# Patient Record
Sex: Female | Born: 1945 | Smoking: Never smoker
Health system: Southern US, Community
[De-identification: ages and names within clinical notes are randomized; demographics above are authoritative.]

---

## 2009-08-05 ENCOUNTER — Ambulatory Visit
Admit: 2009-08-05 | Disposition: A | Discharge status: Home / Self Care | Payer: Self-pay | Source: Ambulatory Visit | Admitting: Obstetrics & Gynecology

## 2009-08-08 ENCOUNTER — Ambulatory Visit
Admit: 2009-08-08 | Disposition: A | Discharge status: Home / Self Care | Payer: Self-pay | Source: Ambulatory Visit | Admitting: Obstetrics & Gynecology

## 2014-10-05 ENCOUNTER — Encounter: Payer: Self-pay | Admitting: Internal Medicine

## 2014-10-05 ENCOUNTER — Ambulatory Visit: Payer: Medicare Other | Admitting: Internal Medicine

## 2014-10-05 VITALS — BP 134/85 | HR 101 | Wt 225.0 lb

## 2014-10-05 DIAGNOSIS — J301 Allergic rhinitis due to pollen: Secondary | ICD-10-CM | POA: Insufficient documentation

## 2014-10-05 DIAGNOSIS — J029 Acute pharyngitis, unspecified: Secondary | ICD-10-CM

## 2014-10-05 DIAGNOSIS — I10 Essential (primary) hypertension: Secondary | ICD-10-CM

## 2014-10-05 MED ORDER — AZITHROMYCIN 250 MG PO TABS
ORAL_TABLET | ORAL | Status: DC
Start: 2014-10-05 — End: 2014-10-05

## 2014-10-05 MED ORDER — AZITHROMYCIN 250 MG PO TABS
ORAL_TABLET | ORAL | Status: AC
Start: 2014-10-05 — End: 2014-10-10

## 2014-10-05 NOTE — Progress Notes (Signed)
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 azithromycin (ZITHROMAX Z-PAK) 250 MG tablet Take 2 tablets (500 mg) on  Day 1,  followed by 1 tablet (250 mg) once daily on Days 2 through 5. 6 tablet 0   . hydrochlorothiazide (HYDRODIURIL) 25 MG tablet TAKE ONE TABLET BY MOUTH EVERY DAY  3     No current facility-administered medications for this visit.       Review of Systems:   A comprehensive review of systems was:   General ROS: no fever chills like [place]  Respiratory ROS: no cough, shortness of breath, or wheezing  Cardiovascular ROS: no chest pain or dyspnea on exertion  Gastrointestinal ROS: no abdominal pain, change in bowel habits, or black or bloody stools  Genito-Urinary ROS: no dysuria, trouble voiding, or hematuria  Neurological ROS: no TIA or stroke symptoms    Physical Exam:     Filed Vitals:    10/05/14 1133   BP: 134/85   Pulse: 101   throat congested mucosa pale extremities are normal    General appearance - alert, well appearing, and in no distress  Chest - clear to auscultation, no wheezes, rales or rhonchi, symmetric air entry  Heart - normal rate, regular rhythm, normal S1, S2, no murmurs, rubs, clicks or gallops  Abdomen - soft, nontender, nondistended, no masses or organomegaly  Neurological - alert, oriented, normal speech, no focal findings or movement disorder noted  Extremities - peripheral pulses normal, no pedal edema, no clubbing or cyanosis    Labs:     Results     ** No results found for the last 24 hours. **            Rads:     Radiology Results (24 Hour)     ** No results found for the last 24 hours. **            Assessment       Patient Active Problem List    Diagnosis Date Noted   . HTN (hypertension), benign 10/05/2014   . Allergic rhinitis due to pollen 10/05/2014   . Acute pharyngitis 10/05/2014       Assessment  And Plan:   1.hypertension BP is well controlled on hydrochlorothiazide 25 mg daily advised potassium supplementations, bananas patient advised to consult restricted diet and regular exercise and weight loss program  2.  Allergic rhinitis advised antihistamine  3.  Acute pharyngitis patient put on azithromycin  4.  Obesity patient advised to 1800-calorie diet exercise because program  She refused flu vaccine  Plan for labs next visit she will be reviewed in 6 months    Signed by: Maida Sale

## 2014-11-16 ENCOUNTER — Ambulatory Visit: Payer: Medicare Other | Admitting: Internal Medicine

## 2014-11-16 ENCOUNTER — Encounter: Payer: Self-pay | Admitting: Internal Medicine

## 2014-11-16 VITALS — BP 119/72 | HR 101 | Ht 63.0 in

## 2014-11-16 DIAGNOSIS — Z0000 Encounter for general adult medical examination without abnormal findings: Secondary | ICD-10-CM

## 2014-11-16 DIAGNOSIS — D508 Other iron deficiency anemias: Secondary | ICD-10-CM

## 2014-11-16 DIAGNOSIS — E6609 Other obesity due to excess calories: Secondary | ICD-10-CM

## 2014-11-19 DIAGNOSIS — Z23 Encounter for immunization: Secondary | ICD-10-CM

## 2014-11-20 LAB — BASIC METABOLIC PANEL
BUN / Creatinine Ratio: 17 (ref 11–26)
BUN: 12 mg/dL (ref 8–27)
CO2: 27 mmol/L (ref 18–29)
Calcium: 10 mg/dL (ref 8.7–10.3)
Chloride: 99 mmol/L (ref 97–106)
Creatinine: 0.72 mg/dL (ref 0.57–1.00)
EGFR: 100 mL/min/{1.73_m2} (ref >59)
EGFR: 86 mL/min/{1.73_m2} (ref >59)
Glucose: 85 mg/dL (ref 65–99)
Potassium: 3.7 mmol/L (ref 3.5–5.2)
Sodium: 141 mmol/L (ref 136–144)

## 2014-11-20 LAB — CBC AND DIFFERENTIAL
Baso(Absolute): 0 10*3/uL (ref 0.0–0.2)
Basos: 0 %
Eos: 1 %
Eosinophils Absolute: 0.1 10*3/uL (ref 0.0–0.4)
Hematocrit: 38.4 % (ref 34.0–46.6)
Hemoglobin: 12.4 g/dL (ref 11.1–15.9)
Immature Granulocytes Absolute: 0 10*3/uL (ref 0.0–0.1)
Immature Granulocytes: 0 %
Lymphocytes Absolute: 4.2 10*3/uL — ABNORMAL HIGH (ref 0.7–3.1)
Lymphocytes: 41 %
MCH: 28.1 pg (ref 26.6–33.0)
MCHC: 32.3 g/dL (ref 31.5–35.7)
MCV: 87 fL (ref 79–97)
Monocytes Absolute: 0.9 10*3/uL (ref 0.1–0.9)
Monocytes: 9 %
Neutrophils Absolute: 4.9 10*3/uL (ref 1.4–7.0)
Neutrophils: 49 %
Platelets: 357 10*3/uL (ref 150–379)
RBC: 4.42 x10E6/uL (ref 3.77–5.28)
RDW: 12.8 % (ref 12.3–15.4)
WBC: 10.1 10*3/uL (ref 3.4–10.8)

## 2014-11-20 LAB — IRON PROFILE
Iron Saturation: 14 % — ABNORMAL LOW (ref 15–55)
Iron: 50 ug/dL (ref 27–139)
TIBC: 356 ug/dL (ref 250–450)
UIBC: 306 ug/dL (ref 118–369)

## 2014-11-20 LAB — HEMOGLOBIN A1C: Hemoglobin A1C: 6 % — ABNORMAL HIGH (ref 4.8–5.6)

## 2015-02-07 ENCOUNTER — Ambulatory Visit: Payer: BLUE CROSS/BLUE SHIELD | Admitting: Internal Medicine

## 2015-02-15 ENCOUNTER — Ambulatory Visit: Payer: Medicare Other | Admitting: Internal Medicine

## 2015-02-15 VITALS — BP 143/90 | HR 97 | Ht 63.0 in | Wt 220.0 lb

## 2015-02-15 DIAGNOSIS — I10 Essential (primary) hypertension: Secondary | ICD-10-CM

## 2015-02-15 DIAGNOSIS — E6609 Other obesity due to excess calories: Secondary | ICD-10-CM | POA: Insufficient documentation

## 2015-02-15 MED ORDER — HYDROCHLOROTHIAZIDE 25 MG PO TABS
ORAL_TABLET | ORAL | Status: DC
Start: 2015-02-15 — End: 2016-04-02

## 2015-05-13 NOTE — Progress Notes (Signed)
PROGRESS NOTE    Date Time: November 16, 2014  Patient Name: Jamie Sharp, Jamie Sharp    Subjective:   Patient comfort follow-up of hypertension , exogenous obesity  She is doing well  Patient is on diet exercise  Not lost much weight  Neck any muscle cramps  No history of diabetes hyperlipidemia coronary artery disease  Medications:     Current outpatient prescriptions:   .  hydroCHLOROthiazide (HYDRODIURIL) 25 MG tablet, TAKE ONE TABLET BY MOUTH EVERY DAY, Disp: 90 tablet, Rfl: 4                Review of Systems:   A comprehensive review of systems was:   General ROS: no fever chills like [place]  Respiratory ROS: no cough, shortness of breath, or wheezing  Cardiovascular ROS: no chest pain or dyspnea on exertion  Gastrointestinal ROS: no abdominal pain, change in bowel habits, or black or bloody stools  Genito-Urinary ROS: no dysuria, trouble voiding, or hematuria  Neurological ROS: no TIA or stroke symptoms    Physical Examnination  Filed Vitals:    11/16/14 1223   BP: 119/72   Pulse: 101   Height: 1.6 m (5\' 3" )   SpO2: 100%     Height 5 feet 3 inches    General appearance - alert, well appearing, and in no distress  Chest - clear to auscultation, no wheezes, rales or rhonchi, symmetric air entry  Heart - normal rate, regular rhythm, normal S1, S2, no murmurs, rubs, clicks or gallops  Abdomen - soft, nontender, nondistended, no masses or organomegaly  Neurological - alert, oriented, normal speech, no focal findings or movement disorder noted  Extremities - peripheral pulses normal, no pedal edema, no clubbing or cyanosis    Labs:      Advised CBC Chem-7 lipid panel             Rads:     Radiology Results (24 Hour)     ** No results found for the last 24 hours. **            Assessment      Patient Active Problem List    Diagnosis Date Noted   . HTN (hypertension), benign 10/05/2014   . Allergic rhinitis due to pollen 10/05/2014   . Acute pharyngitis 10/05/2014        Assessment And Plan:   1.Hypertension BP is well controlled on hydrochlorothiazide 25 mg daily advised potassium supplementations, bananas. patient advised to continue 2 g salt restricted diet and regular exercise and weight loss program  2. Allergic rhinitis advised antihistamine  4. Obesity patient advised to 1800-calorie diet exercise because program  She refused flu vaccine  Plan for labs  she will be reviewed in 6 months    Signed by: Maida Sale

## 2015-05-16 ENCOUNTER — Ambulatory Visit: Payer: BLUE CROSS/BLUE SHIELD | Admitting: Internal Medicine

## 2015-05-19 NOTE — Progress Notes (Signed)
PROGRESS NOTE    Date Time: February 15, 2015  Patient Name: Jamie Sharp, Jamie Sharp    Subjective:   Patient comfort follow-up of hypertension , exogenous obesity  She is doing well.she is very concerned about tingling numbness of her left hand.  She was to make sure nothing serious going on.  Denies any neck pain and radical symptoms denies any diplopia or dysphagia   she has no risk  factors other than mild hypertension and obesity  Patient is on diet exercise  Not lost much weight  Neck any muscle cramps  No history of diabetes hyperlipidemia coronary artery disease  Medications:     Current outpatient prescriptions:   . hydroCHLOROthiazide (HYDRODIURIL) 25 MG tablet, TAKE ONE TABLET BY MOUTH EVERY DAY, Disp: 90 tablet, Rfl: 4                Review of Systems:   A comprehensive review of systems was:   General ROS: no fever chills like [place]  Respiratory ROS: no cough, shortness of breath, or wheezing  Cardiovascular ROS: no chest pain or dyspnea on exertion  Gastrointestinal ROS: no abdominal pain, change in bowel habits, or black or bloody stools  Genito-Urinary ROS: no dysuria, trouble voiding, or hematuria  Neurological ROS: no TIA or stroke symptoms    Physical Examnination  Filed Vitals:    02/15/15 1254   BP: 143/90   Pulse: 97   Height: 1.6 m (5\' 3" )   Weight: 99.791 kg (220 lb)   SpO2: 96%       Height 5 feet 3 inches    General appearance - alert, well appearing, and in no distress  Chest - clear to auscultation, no wheezes, rales or rhonchi, symmetric air entry  Heart - normal rate, regular rhythm, normal S1, S2, no murmurs, rubs, clicks or gallops  Abdomen - soft, nontender, nondistended, no masses or organomegaly  Neurological - alert, oriented, normal speech, no focal findings or movement disorder noted  Extremities - peripheral pulses normal, no pedal edema, no clubbing or cyanosis    Labs:      Advised CBC Chem-7 lipid panel             Rads:     Radiology Results (24  Hour)     ** No results found for the last 24 hours. **            Assessment      Patient Active Problem List    Diagnosis Date Noted   . HTN (hypertension), benign 10/05/2014   . Allergic rhinitis due to pollen 10/05/2014   . Acute pharyngitis 10/05/2014       Assessment And Plan:   1.Hypertension BP is well controlled on hydrochlorothiazide 25 mg daily advised potassium supplementations, bananas. patient advised to continue 2 g salt restricted diet and regular exercise and weight loss program  2. Allergic rhinitis advised antihistamine  4. Obesity patient advised to 1800-calorie diet exercise because program  5. Carpal tunnel syndrome  Patient given reassurance    Plan for labs she will be reviewed in 6 months    Signed by: Maida Sale

## 2015-07-23 ENCOUNTER — Ambulatory Visit: Payer: BLUE CROSS/BLUE SHIELD | Admitting: Internal Medicine

## 2015-07-23 ENCOUNTER — Encounter: Payer: Self-pay | Admitting: Internal Medicine

## 2015-07-23 ENCOUNTER — Ambulatory Visit: Payer: Medicare Other | Admitting: Internal Medicine

## 2015-07-23 DIAGNOSIS — J301 Allergic rhinitis due to pollen: Secondary | ICD-10-CM

## 2015-07-23 DIAGNOSIS — I10 Essential (primary) hypertension: Secondary | ICD-10-CM

## 2015-07-23 DIAGNOSIS — Z0000 Encounter for general adult medical examination without abnormal findings: Secondary | ICD-10-CM | POA: Insufficient documentation

## 2015-07-23 DIAGNOSIS — R7303 Prediabetes: Secondary | ICD-10-CM

## 2015-07-23 DIAGNOSIS — K635 Polyp of colon: Secondary | ICD-10-CM

## 2015-07-23 HISTORY — DX: Polyp of colon: K63.5

## 2015-07-23 HISTORY — DX: Prediabetes: R73.03

## 2015-07-23 MED ORDER — AZITHROMYCIN 250 MG PO TABS
ORAL_TABLET | ORAL | Status: AC
Start: 2015-07-23 — End: 2015-07-28

## 2015-07-23 NOTE — Progress Notes (Signed)
Date Time: 07/23/2015 12:42 PM  Patient Name: Jamie Sharp, Jamie Sharp   DOB: [birth year]    Subjective:   Jamie Sharp is a 70 y.o. female who presents for an annual physical examination she has been deemed prediabetic sometime ago she does admit that she is not good with her diet.  She is up-to-date with her Pap smears mammograms she also had a colonoscopy 2 or 3 years ago and she said she found some polyps which was removed.  No breathing troubles or chest pains no polyuria polydipsia    Past Medical History:     Past Medical History   Diagnosis Date   . Colon polyps 07/23/2015   . Prediabetes 07/23/2015       Past Surgical History:   History reviewed. No pertinent past surgical history.    Family History:     Family History   Problem Relation Age of Onset   . No known problems Mother    . No known problems Father    . No known problems Maternal Grandmother    . No known problems Maternal Grandfather    . No known problems Paternal Grandmother    . No known problems Paternal Grandfather        Social History:     Social History     Social History   . Marital Status: Divorced     Spouse Name: N/A   . Number of Children: N/A   . Years of Education: N/A     Social History Main Topics   . Smoking status: Never Smoker    . Smokeless tobacco: None   . Alcohol Use: None   . Drug Use: None   . Sexual Activity: Not Asked     Other Topics Concern   . None     Social History Narrative       Allergies:     Allergies   Allergen Reactions   . Amoxicillin Angioedema   . Codeine      hallucination   . Cortisone        Medications:     Current Outpatient Prescriptions   Medication Sig Dispense Refill   . hydroCHLOROthiazide (HYDRODIURIL) 25 MG tablet TAKE ONE TABLET BY MOUTH EVERY DAY 90 tablet 4   . azithromycin (ZITHROMAX Z-PAK) 250 MG tablet Take 2 tablets (500 mg) on  Day 1,  followed by 1 tablet (250 mg) once daily on Days 2 through 5. 6 tablet 0     No current facility-administered medications for this visit.        Review of Systems:   A comprehensive review of systems was:   General ROS:   Respiratory ROS: no cough, shortness of breath, or wheezing  Cardiovascular ROS: no chest pain or dyspnea on exertion  Gastrointestinal ROS: no abdominal pain, change in bowel habits, or black or bloody stools  Genito-Urinary ROS: no dysuria, trouble voiding, or hematuria  Neurological ROS: no TIA or stroke symptoms    Physical Exam:     Filed Vitals:    07/23/15 1206   BP: 148/98   Pulse: 86   SpO2: 96%     BP Readings from Last 3 Encounters:   07/23/15 148/98   02/15/15 143/90   11/16/14 119/72       General appearance - alert, well appearing, and in no distress  Chest - clear to auscultation, no wheezes, rales or rhonchi, symmetric air entry  Heart - normal rate, regular rhythm, normal S1, S2, no  murmurs, rubs, clicks or gallops  Abdomen - soft, nontender, nondistended, no masses or organomegaly  Neurological - alert, oriented, normal speech, no focal findings or movement disorder noted  Extremities - peripheral pulses normal, no pedal edema, no clubbing or cyanosis    Labs:   No results found for this or any previous visit (from the past 2016 hour(s)).    Assessment and Plan:     Patient Active Problem List   Diagnosis   . HTN (hypertension), benign   . Allergic rhinitis due to pollen   . Acute pharyngitis   . 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 Colon polyps   . Routine history and physical examination of adult   . Prediabetes       Orders Placed This Encounter   Procedures   . Hemoglobin A1C       1. nonmorbid obesity she was encouraged to try to exercise and lose weight however in view of her age, actually that she is able to do that    2.  Prediabetes I will check her A1c and she will call me back in 1 week's time    3.  Colonic polyps status post polypectomy she will probably have to go back for repeat colonoscopy at some stage    4.  Upper respiratory tract infection Zithromax Tri-Pak was given along with  over-the-counter Benadryl for scratchy throat    5.  Hypertension appears to be very well controlled with a good BMP    6.  Healthcare maintenance no need for blood work to be done RTC in 3 months sooner if need be    Signed by: Nyra Jabs

## 2015-07-27 LAB — HEMOGLOBIN A1C: Hemoglobin A1C: 5.5 % (ref 4.8–5.6)

## 2016-03-28 ENCOUNTER — Other Ambulatory Visit: Payer: Self-pay | Admitting: Internal Medicine

## 2016-03-30 NOTE — Telephone Encounter (Signed)
Long-time no see

## 2016-04-01 NOTE — Telephone Encounter (Signed)
Left a voicemail to schedule

## 2016-04-02 ENCOUNTER — Other Ambulatory Visit: Payer: Self-pay | Admitting: Internal Medicine

## 2016-04-02 MED ORDER — HYDROCHLOROTHIAZIDE 25 MG PO TABS
ORAL_TABLET | ORAL | 0 refills | Status: DC
Start: 2016-04-02 — End: 2016-04-07

## 2016-04-07 ENCOUNTER — Ambulatory Visit: Payer: Medicare Other | Admitting: Internal Medicine

## 2016-04-07 ENCOUNTER — Encounter: Payer: Self-pay | Admitting: Internal Medicine

## 2016-04-07 VITALS — BP 138/85 | HR 80 | Ht 63.0 in | Wt 218.0 lb

## 2016-04-07 DIAGNOSIS — R7303 Prediabetes: Secondary | ICD-10-CM

## 2016-04-07 DIAGNOSIS — I10 Essential (primary) hypertension: Secondary | ICD-10-CM

## 2016-04-07 DIAGNOSIS — G5602 Carpal tunnel syndrome, left upper limb: Secondary | ICD-10-CM | POA: Insufficient documentation

## 2016-04-07 MED ORDER — HYDROCHLOROTHIAZIDE 25 MG PO TABS
ORAL_TABLET | ORAL | 6 refills | Status: DC
Start: 2016-04-07 — End: 2017-05-07

## 2016-04-07 NOTE — Progress Notes (Signed)
Date Time: 04/07/2016 10:44 PM  Patient Name: ANAJULIA, LEYENDECKER   DOB: November 18, 1945    Subjective:   Retta Diones Eriyonna Matsushita is a 71 y.o. female who presents for an annual physical examination      Patient Is a Known Case of Obesity, Hypertension And past History of Right Upper Extremity Carpal Tunnel Syndrome.   Patient Complains of Left Hand Going Numb on and off..  It Improves after Shaking. She Denies Any Radicular Symptoms. Patient  History. 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 She Denies Any Muscle Cramps   Patient Gives a History of Postmenopausal Spotting. She Had a Pelvic Sonogram Was Told to Be Normal. She Is Scheduled to Have Fractional Curettage Biopsy      Past Medical History:     Past Medical History:   Diagnosis Date   . Colon polyps 07/23/2015   . Prediabetes 07/23/2015       Past Surgical History:   History reviewed. No pertinent surgical history.    Family History:     Family History   Problem Relation Age of Onset   . No known problems Mother    . No known problems Father    . No known problems Maternal Grandmother    . No known problems Maternal Grandfather    . No known problems Paternal Grandmother    . No known problems Paternal Grandfather        Social History:     Social History     Social History   . Marital status: Divorced     Spouse name: N/A   . Number of children: N/A   . Years of education: N/A     Social History Main Topics   . Smoking status: Never Smoker   . Smokeless tobacco: None   . Alcohol use None   . Drug use: Unknown   . Sexual activity: Not Asked     Other Topics Concern   . None     Social History Narrative   . None       Allergies:     Allergies   Allergen Reactions   . Amoxicillin Angioedema   . Codeine      hallucination   . Cortisone        Medications:     Current Outpatient  Prescriptions   Medication Sig Dispense Refill   . hydroCHLOROthiazide (HYDRODIURIL) 25 MG tablet TAKE ONE TABLET BY MOUTH EVERY DAY 90 tablet 6     No current facility-administered medications for this visit.        Review of Systems:   A comprehensive review of systems was:   General ROS:   Respiratory ROS: no cough, shortness of breath, or wheezing  Cardiovascular ROS: no chest pain or dyspnea on exertion  Gastrointestinal ROS: no abdominal pain, change in bowel habits, or black or bloody stools  Genito-Urinary ROS:  Complaints of Postmenopausal Bleeding  Neurological ROS: no TIA or stroke symptoms    Physical Exam:     Vitals:    04/07/16 1315   BP: 138/85   Pulse: 80   SpO2: 98%     BP Readings from Last 3 Encounters:   04/07/16 138/85   07/23/15 (!) 148/98   02/15/15 143/90     No Pallor,  icterus, clubbing,lymphadenopathy  Neck is supple  No xanthoma , xanthelesma  No carotid bruit  JVP not elevated  No thyroidomegaly  HEENT PEERLA    General appearance - alert, well appearing, and in no distress  Chest - clear to auscultation, no wheezes, rales or rhonchi, symmetric air entry  Heart - normal rate, regular rhythm, normal S1, S2, no murmurs, rubs, clicks or gallops  Abdomen -  No OrganomegalyNeurological - alert, oriented, normal speech, no focal findings or movement disorder noted  Extremities - peripheral pulses normal, no pedal edema, no clubbing or cyanosis   Left and No Wasting of Intervention  Labs:   No results found for this or any previous visit (from the past 2016 hour(s)).    Assessment and Plan:       1. Carpal tunnel syndrome/ patient advised nerve conduction.    2. Hypertension appears to be very well controlled   3.  Colonic polyps status post polypectomy   4.   Postmenopausal Bleeding. She Has Mild Spotting She Already Had Pelvic Sonogram Told to Be Normal Patient Scheduled for Fractional Biopsy A GYN  5.   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   Obesity. Patient Advised  Diet and Exercise Weight Loss Program    Patient Active Problem List   Diagnosis   . HTN (hypertension), benign   . Allergic rhinitis due to pollen   . Acute pharyngitis   . 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 Colon polyps   . Routine history and physical examination of adult   . Prediabetes   . Carpal tunnel syndrome of left wrist       Orders Placed This Encounter   Procedures   . Basic Metabolic Panel     Order Specific Question:   Has the patient fasted?     Answer:   Yes   . CBC and differential   . Hemoglobin A1C   . Hepatic function panel (LFT)   . Lipid panel     Order Specific Question:   Has the patient fasted?     Answer:   Yes   . TSH   . Urinalysis     Order Specific Question:   URINE TYPE     Answer:   Clean Catch       Signed by: Maida Sale

## 2016-04-10 LAB — HEPATIC FUNCTION PANEL
ALT: 20 IU/L (ref 0–32)
AST (SGOT): 17 IU/L (ref 0–40)
Albumin: 4.1 g/dL (ref 3.6–4.8)
Alkaline Phosphatase: 74 IU/L (ref 39–117)
Bilirubin Direct: 0.15 mg/dL (ref 0.00–0.40)
Bilirubin, Total: 0.5 mg/dL (ref 0.0–1.2)
Protein, Total: 7.1 g/dL (ref 6.0–8.5)

## 2016-04-10 LAB — LIPID PANEL
Cholesterol / HDL Ratio: 2.2 ratio (ref 0.0–4.4)
Cholesterol: 181 mg/dL (ref 100–199)
HDL: 81 mg/dL (ref >39)
LDL Calculated: 89 mg/dL (ref 0–99)
Triglycerides: 53 mg/dL (ref 0–149)
VLDL Calculated: 11 mg/dL (ref 5–40)

## 2016-04-10 LAB — CBC AND DIFFERENTIAL
Baso(Absolute): 0 10*3/uL (ref 0.0–0.2)
Basos: 0 %
Eos: 1 %
Eosinophils Absolute: 0.1 10*3/uL (ref 0.0–0.4)
Hematocrit: 38.3 % (ref 34.0–46.6)
Hemoglobin: 12.4 g/dL (ref 11.1–15.9)
Immature Granulocytes Absolute: 0 10*3/uL (ref 0.0–0.1)
Immature Granulocytes: 0 %
Lymphocytes Absolute: 3.1 10*3/uL (ref 0.7–3.1)
Lymphocytes: 42 %
MCH: 28.1 pg (ref 26.6–33.0)
MCHC: 32.4 g/dL (ref 31.5–35.7)
MCV: 87 fL (ref 79–97)
Monocytes Absolute: 0.6 10*3/uL (ref 0.1–0.9)
Monocytes: 8 %
Neutrophils Absolute: 3.6 10*3/uL (ref 1.4–7.0)
Neutrophils: 49 %
Platelets: 362 10*3/uL (ref 150–379)
RBC: 4.42 x10E6/uL (ref 3.77–5.28)
RDW: 12.9 % (ref 12.3–15.4)
WBC: 7.4 10*3/uL (ref 3.4–10.8)

## 2016-04-10 LAB — BASIC METABOLIC PANEL
BUN / Creatinine Ratio: 13 (ref 12–28)
BUN: 10 mg/dL (ref 8–27)
CO2: 26 mmol/L (ref 18–29)
Calcium: 9.7 mg/dL (ref 8.7–10.3)
Chloride: 103 mmol/L (ref 96–106)
Creatinine: 0.75 mg/dL (ref 0.57–1.00)
EGFR: 82 mL/min/{1.73_m2} (ref >59)
EGFR: 94 mL/min/{1.73_m2} (ref >59)
Glucose: 83 mg/dL (ref 65–99)
Potassium: 4.1 mmol/L (ref 3.5–5.2)
Sodium: 142 mmol/L (ref 134–144)

## 2016-04-10 LAB — TSH: TSH: 0.546 u[IU]/mL (ref 0.450–4.500)

## 2016-04-10 LAB — HEMOGLOBIN A1C: Hemoglobin A1C: 5.7 % — ABNORMAL HIGH (ref 4.8–5.6)

## 2016-05-06 ENCOUNTER — Encounter: Payer: Self-pay | Admitting: Internal Medicine

## 2016-05-07 ENCOUNTER — Other Ambulatory Visit: Payer: Self-pay | Admitting: Internal Medicine

## 2016-05-07 MED ORDER — AZITHROMYCIN 250 MG PO TABS
ORAL_TABLET | ORAL | 0 refills | Status: DC
Start: 2016-05-07 — End: 2016-09-21

## 2016-08-25 ENCOUNTER — Other Ambulatory Visit: Payer: Self-pay | Admitting: Internal Medicine

## 2016-08-25 DIAGNOSIS — I10 Essential (primary) hypertension: Secondary | ICD-10-CM

## 2016-08-25 DIAGNOSIS — R7303 Prediabetes: Secondary | ICD-10-CM

## 2016-08-25 DIAGNOSIS — J301 Allergic rhinitis due to pollen: Secondary | ICD-10-CM

## 2016-08-25 DIAGNOSIS — Z0000 Encounter for general adult medical examination without abnormal findings: Secondary | ICD-10-CM

## 2016-08-25 DIAGNOSIS — J029 Acute pharyngitis, unspecified: Secondary | ICD-10-CM

## 2016-08-25 DIAGNOSIS — G5602 Carpal tunnel syndrome, left upper limb: Secondary | ICD-10-CM

## 2016-08-25 DIAGNOSIS — K635 Polyp of colon: Secondary | ICD-10-CM

## 2016-08-26 ENCOUNTER — Other Ambulatory Visit: Payer: Self-pay | Admitting: Internal Medicine

## 2016-08-26 DIAGNOSIS — Z1239 Encounter for other screening for malignant neoplasm of breast: Secondary | ICD-10-CM

## 2016-09-21 ENCOUNTER — Other Ambulatory Visit: Payer: Self-pay | Admitting: Internal Medicine

## 2016-09-21 MED ORDER — AZITHROMYCIN 250 MG PO TABS
ORAL_TABLET | ORAL | 0 refills | Status: DC
Start: 2016-09-21 — End: 2016-12-11

## 2016-12-11 ENCOUNTER — Encounter: Payer: Self-pay | Admitting: Internal Medicine

## 2016-12-11 ENCOUNTER — Ambulatory Visit: Payer: Medicare Other | Admitting: Internal Medicine

## 2016-12-11 VITALS — BP 185/100 | HR 113 | Ht 63.0 in | Wt 215.0 lb

## 2016-12-11 DIAGNOSIS — R7303 Prediabetes: Secondary | ICD-10-CM

## 2016-12-11 DIAGNOSIS — J069 Acute upper respiratory infection, unspecified: Secondary | ICD-10-CM | POA: Insufficient documentation

## 2016-12-11 DIAGNOSIS — I10 Essential (primary) hypertension: Secondary | ICD-10-CM

## 2016-12-11 MED ORDER — AMLODIPINE BESYLATE 5 MG PO TABS
5.0000 mg | ORAL_TABLET | Freq: Every day | ORAL | 4 refills | Status: DC
Start: 2016-12-11 — End: 2016-12-31

## 2016-12-11 MED ORDER — AZITHROMYCIN 250 MG PO TABS
ORAL_TABLET | ORAL | 0 refills | Status: DC
Start: 2016-12-11 — End: 2016-12-31

## 2016-12-11 NOTE — Progress Notes (Signed)
Tara Rud S Janisse Ghan.M.D.  [facility]  [address]  [place] [address]  Tel. [phone]  Fax [phone]        Date Time: 12/11/2016 10:59 PM  Patient Name: Jamie Sharp, Jamie Sharp   DOB: 1945/08/10    Subjective:   Jamie Sharp is a 71 y.o. female who presents for urgent appointment     Patient Is a Known Case of Obesity, Hypertension And past History of Right Upper Extremity Carpal Tunnel Syndrome.      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  She gives history of stress.   patient complains of stuffy nose postnasal drip and yellow phlegm   and blood pressure is running high at home   Patient Denies Any Chest Pain Orthopnea PND. She Denies Any Muscle Cramps   Patient Gives a History of Postmenopausal Spotting. She Had a Pelvic Sonogram Was Told to Be Normal. She Is Scheduled to Have Fractional Curettage Biopsy      Past Medical History:     Past Medical History:   Diagnosis Date   . Colon polyps 07/23/2015   . Prediabetes 07/23/2015       Past Surgical History:   No past surgical history on file.    Family History:     Family History   Problem Relation Age of Onset   . No known problems Mother    . No known problems Father    . No known problems Maternal Grandmother    . No known problems Maternal Grandfather    . No known problems Paternal Grandmother    . No known problems Paternal Grandfather        Social History:     Social History     Social History   . Marital status: Divorced     Spouse name: N/A   . Number of children: N/A   . Years of education: N/A     Social History Main Topics   . Smoking status: Never Smoker   . Smokeless tobacco: Never Used   . Alcohol use None   . Drug use: Unknown   . Sexual activity: Not Asked     Other Topics Concern   . None     Social History Narrative   . None       Allergies:     Allergies   Allergen Reactions   . Amoxicillin Angioedema   . Codeine      hallucination   . Cortisone        Medications:     Current  Outpatient Prescriptions   Medication Sig Dispense Refill   . hydroCHLOROthiazide (HYDRODIURIL) 25 MG tablet TAKE ONE TABLET BY MOUTH EVERY DAY 90 tablet 6   . amLODIPine (NORVASC) 5 MG tablet Take 1 tablet (5 mg total) by mouth daily. 60 tablet 4   . azithromycin (ZITHROMAX) 250 MG tablet Take two tabs one day and onebtab daily four days 6 tablet 0     No current facility-administered medications for this visit.        Review of Systems:   A comprehensive review of systems was:   General ROS:   Respiratory ROS: no cough, shortness of breath, or wheezing  Cardiovascular ROS: no chest pain or dyspnea on exertion  Gastrointestinal ROS: no abdominal pain, change in bowel habits, or black or bloody stools  Genito-Urinary ROS:  Complaints of Postmenopausal Bleeding  Neurological ROS: no TIA or  stroke symptoms    Physical Exam:     Vitals:    12/11/16 1302   BP: (!) 185/100   Pulse: (!) 113   SpO2: (!) 87%     BP Readings from Last 3 Encounters:   12/11/16 (!) 185/100   04/07/16 138/85   07/23/15 (!) 148/98     No Pallor, icterus, clubbing,lymphadenopathy  Neck is supple  No xanthoma , xanthelesma  No carotid bruit  JVP not elevated  No thyroidomegaly  HEENT PEERLA    General appearance - alert, well appearing, and in no distress  Chest - clear to auscultation, no wheezes, rales or rhonchi, symmetric air entry  Heart - normal rate, regular rhythm, normal S1, S2, no murmurs, rubs, clicks or gallops  Abdomen -  No OrganomegalyNeurological - alert, oriented, normal speech, no focal findings or movement disorder noted  Extremities - peripheral pulses normal, no pedal edema, no clubbing or cyanosis   Left and No Wasting of Intervention  Labs:   No results found for this or any previous visit (from the past 2016 hour(s)).    Assessment and Plan:       1. . Acute upper respiratory tract infection. Patient put on Z-Pak.advised claritin    2.  Accelerated  Hypertension .  Pressure is significantly elevated she is not consistently  taking diuretic. Also she is stressed out because of her mother's. Was elderly and 89   patient started on amlodipine 5 mg daily she is advised to take hydrochlorothiazide every day. Patient advised to follow 2 g salt restricted diet. Should be reviewed in 2 weeks time dressed to come with a logbook of her Pressure readings    3.  Colonic polyps status post polypectomy   4. Anxiety. Patient taking care of her elderly mother. She is advised to discuss about day care center     5.   Obesity. Patient Advised Diet and Exercise Weight Loss Program  7.     Carpal tunnel syndrome symptoms have improved    Patient Active Problem List   Diagnosis   . HTN (hypertension), benign   . Allergic rhinitis due to pollen   . Acute pharyngitis   . 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 Colon polyps   . Routine history and physical examination of adult   . Prediabetes   . Carpal tunnel syndrome of left wrist       No orders of the defined types were placed in this encounter.      Signed by: Maida Sale

## 2016-12-15 ENCOUNTER — Telehealth: Payer: Self-pay | Admitting: Internal Medicine

## 2016-12-15 NOTE — Telephone Encounter (Signed)
Per dr Elza Rafter pt was instructed to take amoldipine and hctz daily together . However pt reports she take the amlodipine in the am and hctz in the evening because it make her use the restroom and she works in a classroom during the day

## 2016-12-31 ENCOUNTER — Encounter: Payer: Self-pay | Admitting: Internal Medicine

## 2016-12-31 ENCOUNTER — Ambulatory Visit: Payer: Medicare Other | Admitting: Internal Medicine

## 2016-12-31 ENCOUNTER — Ambulatory Visit: Payer: BLUE CROSS/BLUE SHIELD | Admitting: Internal Medicine

## 2016-12-31 VITALS — BP 151/101 | HR 107 | Ht 63.0 in | Wt 208.0 lb

## 2016-12-31 DIAGNOSIS — I10 Essential (primary) hypertension: Secondary | ICD-10-CM

## 2016-12-31 DIAGNOSIS — R7303 Prediabetes: Secondary | ICD-10-CM

## 2016-12-31 NOTE — Progress Notes (Signed)
Date Time: 12/31/2016 12:58 PM  Patient Name: Jamie Sharp, Jamie Sharp   DOB: 14-Nov-1945    Subjective:   Jamie Sharp is a 71 y.o. female who presents for a follow-up visit  She was given amlodipine about 3 weeks ago to get her blood pressure under control however she says that she could not tolerate it she felt she was going to die and she stopped it  Currently no breathing troubles or chest pains no bowel or urinary problems    Past Medical History:     Past Medical History:   Diagnosis Date   . Colon polyps 07/23/2015   . Prediabetes 07/23/2015       Past Surgical History:   No past surgical history on file.    Family History:     Family History   Problem Relation Age of Onset   . No known problems Mother    . No known problems Father    . No known problems Maternal Grandmother    . No known problems Maternal Grandfather    . No known problems Paternal Grandmother    . No known problems Paternal Grandfather        Social History:     Social History     Social History   . Marital status: Divorced     Spouse name: N/A   . Number of children: N/A   . Years of education: N/A     Social History Main Topics   . Smoking status: Never Smoker   . Smokeless tobacco: Never Used   . Alcohol use None   . Drug use: Unknown   . Sexual activity: Not Asked     Other Topics Concern   . None     Social History Narrative   . None       Allergies:     Allergies   Allergen Reactions   . Amoxicillin Angioedema   . Codeine      hallucination   . Cortisone        Medications:     Current Outpatient Prescriptions   Medication Sig Dispense Refill   . hydroCHLOROthiazide (HYDRODIURIL) 25 MG tablet TAKE ONE TABLET BY MOUTH EVERY DAY 90 tablet 6     No current facility-administered medications for this visit.        Review of Systems:   A comprehensive review of systems was:   General ROS:   Respiratory ROS: no cough, shortness of breath, or wheezing  Cardiovascular ROS: no chest pain or dyspnea on exertion  Gastrointestinal  ROS: no abdominal pain, change in bowel habits, or black or bloody stools  Genito-Urinary ROS: no dysuria, trouble voiding, or hematuria  Neurological ROS: no TIA or stroke symptoms    Physical Exam:     Vitals:    12/31/16 1139   BP: (!) 151/101   Pulse: (!) 107   SpO2: 99%     BP Readings from Last 3 Encounters:   12/31/16 (!) 151/101   12/11/16 (!) 185/100   04/07/16 138/85       General appearance - alert, well appearing, and in no distress  Chest - clear to auscultation, no wheezes, rales or rhonchi, symmetric air entry  Heart - normal rate, regular rhythm, normal S1, S2, no murmurs, rubs, clicks or gallops  Abdomen - soft, nontender, nondistended, no masses or organomegaly  Neurological - alert, oriented, normal speech, no focal findings or movement disorder noted  Extremities - peripheral pulses normal, no pedal  edema, no clubbing or cyanosis    Labs:   No results found for this or any previous visit (from the past 2016 hour(s)).    Assessment and Plan:     Patient Active Problem List   Diagnosis   . HTN (hypertension), benign   . 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 Prediabetes   . URTI (acute upper respiratory infection)       No orders of the defined types were placed in this encounter.      1. nonmorbid obesity she was encouraged to try to exercise and lose weight however in view of her age, actually that she is able to do that    2.  Prediabetes follow-up with A1c next visit    3.  Colonic polyps status post polypectomy she will probably have to go back for repeat colonoscopy at some stage    4.  Hypertension not well-controlled E Darby 40/25 mg was written out is the only thing she should take take blood pressure readings and come back in 2 weeks' time for follow-up    Signed by: Nyra Jabs

## 2017-01-18 ENCOUNTER — Ambulatory Visit: Payer: BLUE CROSS/BLUE SHIELD | Admitting: Internal Medicine

## 2017-01-20 ENCOUNTER — Encounter: Payer: Self-pay | Admitting: Internal Medicine

## 2017-01-21 ENCOUNTER — Ambulatory Visit: Payer: BLUE CROSS/BLUE SHIELD | Admitting: Internal Medicine

## 2017-01-27 ENCOUNTER — Ambulatory Visit: Payer: BLUE CROSS/BLUE SHIELD | Admitting: Internal Medicine

## 2017-02-09 ENCOUNTER — Ambulatory Visit: Payer: BLUE CROSS/BLUE SHIELD | Admitting: Internal Medicine

## 2017-02-11 ENCOUNTER — Other Ambulatory Visit: Payer: Self-pay | Admitting: Internal Medicine

## 2017-02-11 MED ORDER — AZITHROMYCIN 250 MG PO TABS
ORAL_TABLET | ORAL | 0 refills | Status: AC
Start: 2017-02-11 — End: 2017-02-16

## 2017-02-23 ENCOUNTER — Ambulatory Visit: Payer: BLUE CROSS/BLUE SHIELD | Admitting: Internal Medicine

## 2017-03-02 ENCOUNTER — Ambulatory Visit: Payer: BLUE CROSS/BLUE SHIELD | Admitting: Internal Medicine

## 2017-03-09 ENCOUNTER — Ambulatory Visit: Payer: BLUE CROSS/BLUE SHIELD | Admitting: Internal Medicine

## 2017-03-11 ENCOUNTER — Ambulatory Visit: Payer: BLUE CROSS/BLUE SHIELD | Admitting: Internal Medicine

## 2017-03-12 ENCOUNTER — Ambulatory Visit: Payer: Medicare Other | Admitting: Internal Medicine

## 2017-03-12 ENCOUNTER — Encounter: Payer: Self-pay | Admitting: Internal Medicine

## 2017-03-12 VITALS — BP 142/90 | HR 78 | Wt 211.0 lb

## 2017-03-12 DIAGNOSIS — Z0000 Encounter for general adult medical examination without abnormal findings: Secondary | ICD-10-CM

## 2017-03-12 DIAGNOSIS — I10 Essential (primary) hypertension: Secondary | ICD-10-CM

## 2017-03-12 DIAGNOSIS — M1712 Unilateral primary osteoarthritis, left knee: Secondary | ICD-10-CM | POA: Insufficient documentation

## 2017-03-12 DIAGNOSIS — J069 Acute upper respiratory infection, unspecified: Secondary | ICD-10-CM

## 2017-03-12 DIAGNOSIS — R7303 Prediabetes: Secondary | ICD-10-CM

## 2017-03-12 NOTE — Progress Notes (Signed)
Eiliana Drone S Azyriah Nevins.M.D.  [facility]  [address]  [place] [address]  Tel. [phone]  Fax [phone]        Date Time:03/12/17 0959  Patient Name: Jamie Sharp, Jamie Sharp   DOB: August 12, 1945    Subjective:   Jamie Sharp is a 72 y.o. female who presents for  Follow-up appointment     Patient Is a Known Case of Obesity, Hypertension, past History of Right Upper Extremity Carpal Tunnel Syndrome. And osteoarthritis left knee      She Is Taking Care Of Her Elderly Mom Most 37 Years Old.    patient is doing well   she had labs done in last March she has an excellent LDL fell and HDL of 81      Patient Denies Any Chest Pain Orthopnea PND. She Denies Any Muscle Cramps   patient complains of pain left knee on and off for about is manageable   she is advised to get a blood pressure monitor to keep a recording of 4 blood pressure readings       Past Medical History:     Past Medical History:   Diagnosis Date   . Colon polyps 07/23/2015   . Prediabetes 07/23/2015       Past Surgical History:   No past surgical history on file.    Family History:     Family History   Problem Relation Age of Onset   . No known problems Mother    . No known problems Father    . No known problems Maternal Grandmother    . No known problems Maternal Grandfather    . No known problems Paternal Grandmother    . No known problems Paternal Grandfather        Social History:     Social History     Social History   . Marital status: Divorced     Spouse name: N/A   . Number of children: N/A   . Years of education: N/A     Social History Main Topics   . Smoking status: Never Smoker   . Smokeless tobacco: Never Used   . Alcohol use None   . Drug use: Unknown   . Sexual activity: Not Asked     Other Topics Concern   . None     Social History Narrative   . None       Allergies:     Allergies   Allergen Reactions   . Amoxicillin Angioedema   . Codeine      hallucination   . Cortisone        Medications:      Current Outpatient Prescriptions   Medication Sig Dispense Refill   . hydroCHLOROthiazide (HYDRODIURIL) 25 MG tablet TAKE ONE TABLET BY MOUTH EVERY DAY 90 tablet 6     No current facility-administered medications for this visit.        Review of Systems:   A comprehensive review of systems was:   General ROS:   Respiratory ROS: no cough, shortness of breath, or wheezing  Cardiovascular ROS: no chest pain or dyspnea on exertion  Gastrointestinal ROS: no abdominal pain, change in bowel habits, or black or bloody stools  Genito-Urinary ROS:  Complaints of Postmenopausal Bleeding  Neurological ROS: no TIA or stroke symptoms    Physical Exam:     Vitals:    03/12/17 0959   BP: 142/90   Pulse: 78  BP Readings from Last 3 Encounters:   03/12/17 142/90   12/31/16 (!) 151/101   12/11/16 (!) 185/100     No Pallor, icterus, clubbing,lymphadenopathy  Neck is supple  No xanthoma , xanthelesma  No carotid bruit  JVP not elevated  No thyroidomegaly  HEENT PEERLA    General appearance - alert, well appearing, and in no distress  Chest - clear to auscultation, no wheezes, rales or rhonchi, symmetric air entry  Heart - normal rate, regular rhythm, normal S1, S2, no murmurs, rubs, clicks or gallops  Abdomen -  No OrganomegalyNeurological - alert, oriented, normal speech, no focal findings or movement disorder noted  Extremities - peripheral pulses normal, no pedal edema, no clubbing or cyanosis   Left and No Wasting of Intervention  Labs:   No results found for this or any previous visit (from the past 2016 hour(s)).    Assessment and Plan:         1. Hypertension .   Her blood pressure is under good control patient is on a small dose of diuretic hydrochlorothiazide 25 mg daily no muscle cramps patient has no evidence of left ventricular hypertrophy of renal insufficiency she is advised to from salt restricted diet and exercise for weight loss    2.  Colonic polyps status post polypectomy     3.   Obesity.Body mass index is  37.38 kg/m.   Patient Advised Diet and Exercise Weight Loss Program  4. Annuall physical exam. Patient advised complete lab work mammogram . She is current for colonoscopy    Patient Active Problem List   Diagnosis   . HTN (hypertension), benign   . 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 Annual physical exam   . Primary osteoarthritis of left knee       Orders Placed This Encounter   Procedures   . Mammo Digital Screening Bilateral W Cad     Standing Status:   Future     Standing Expiration Date:   05/13/2018     Scheduling Instructions:      Please remind patient to bring MD order, referral or prescription with them on the day of the exam. If the order was faxed from the doctor's office to central scheduling please make sure the order is scanned into Epic.     Order Specific Question:   Select additional PRN/as needed imaging orders     Answer:   Diagnostic mammogram     Order Specific Question:   Reason for Exam:     Answer:   screening breast exam   . CBC and differential   . Basic metabolic panel     Order Specific Question:   Has the patient fasted?     Answer:   Yes   . Urinalysis     Order Specific Question:   URINE TYPE     Answer:   Clean Catch   . Hepatic function panel (LFT) 7   . Lipid panel     Order Specific Question:   Has the patient fasted?     Answer:   Yes       Signed by: Maida Sale

## 2017-03-26 ENCOUNTER — Other Ambulatory Visit: Payer: Self-pay | Admitting: Internal Medicine

## 2017-03-26 MED ORDER — AZITHROMYCIN 250 MG PO TABS
ORAL_TABLET | ORAL | 0 refills | Status: AC
Start: 2017-03-26 — End: 2017-03-31

## 2017-04-24 LAB — HEPATIC FUNCTION PANEL
ALT: 21 IU/L (ref 0–32)
AST (SGOT): 21 IU/L (ref 0–40)
Albumin: 4.1 g/dL (ref 3.5–4.8)
Alkaline Phosphatase: 73 IU/L (ref 39–117)
Bilirubin Direct: 0.12 mg/dL (ref 0.00–0.40)
Bilirubin, Total: 0.4 mg/dL (ref 0.0–1.2)
Protein, Total: 7 g/dL (ref 6.0–8.5)

## 2017-04-24 LAB — CBC AND DIFFERENTIAL
Baso(Absolute): 0 10*3/uL (ref 0.0–0.2)
Basos: 0 %
Eos: 1 %
Eosinophils Absolute: 0.1 10*3/uL (ref 0.0–0.4)
Hematocrit: 35.8 % (ref 34.0–46.6)
Hemoglobin: 11.8 g/dL (ref 11.1–15.9)
Immature Granulocytes Absolute: 0 10*3/uL (ref 0.0–0.1)
Immature Granulocytes: 0 %
Lymphocytes Absolute: 3.1 10*3/uL (ref 0.7–3.1)
Lymphocytes: 41 %
MCH: 28 pg (ref 26.6–33.0)
MCHC: 33 g/dL (ref 31.5–35.7)
MCV: 85 fL (ref 79–97)
Monocytes Absolute: 0.5 10*3/uL (ref 0.1–0.9)
Monocytes: 7 %
Neutrophils Absolute: 3.8 10*3/uL (ref 1.4–7.0)
Neutrophils: 51 %
Platelets: 335 10*3/uL (ref 150–379)
RBC: 4.21 x10E6/uL (ref 3.77–5.28)
RDW: 13.5 % (ref 12.3–15.4)
WBC: 7.5 10*3/uL (ref 3.4–10.8)

## 2017-04-24 LAB — LIPID PANEL
Cholesterol / HDL Ratio: 2.1 ratio (ref 0.0–4.4)
Cholesterol: 172 mg/dL (ref 100–199)
HDL: 82 mg/dL (ref >39)
LDL Calculated: 81 mg/dL (ref 0–99)
Triglycerides: 47 mg/dL (ref 0–149)
VLDL Calculated: 9 mg/dL (ref 5–40)

## 2017-04-24 LAB — BASIC METABOLIC PANEL
BUN / Creatinine Ratio: 15 (ref 12–28)
BUN: 12 mg/dL (ref 8–27)
CO2: 24 mmol/L (ref 20–29)
Calcium: 9.4 mg/dL (ref 8.7–10.3)
Chloride: 100 mmol/L (ref 96–106)
Creatinine: 0.82 mg/dL (ref 0.57–1.00)
EGFR: 72 mL/min/{1.73_m2} (ref >59)
EGFR: 83 mL/min/{1.73_m2} (ref >59)
Glucose: 99 mg/dL (ref 65–99)
Potassium: 3.7 mmol/L (ref 3.5–5.2)
Sodium: 140 mmol/L (ref 134–144)

## 2017-05-07 ENCOUNTER — Other Ambulatory Visit: Payer: Self-pay | Admitting: Internal Medicine

## 2017-07-01 ENCOUNTER — Ambulatory Visit: Payer: BLUE CROSS/BLUE SHIELD | Admitting: Internal Medicine

## 2017-08-06 ENCOUNTER — Ambulatory Visit: Payer: Medicare Other | Admitting: Internal Medicine

## 2017-08-06 VITALS — BP 146/86 | HR 85 | Wt 214.0 lb

## 2017-08-06 DIAGNOSIS — M2011 Hallux valgus (acquired), right foot: Secondary | ICD-10-CM | POA: Insufficient documentation

## 2017-08-06 DIAGNOSIS — Z01818 Encounter for other preprocedural examination: Secondary | ICD-10-CM | POA: Insufficient documentation

## 2017-08-06 DIAGNOSIS — M2042 Other hammer toe(s) (acquired), left foot: Secondary | ICD-10-CM

## 2017-08-06 DIAGNOSIS — I10 Essential (primary) hypertension: Secondary | ICD-10-CM

## 2017-08-06 DIAGNOSIS — Z0000 Encounter for general adult medical examination without abnormal findings: Secondary | ICD-10-CM

## 2017-08-06 DIAGNOSIS — M2012 Hallux valgus (acquired), left foot: Secondary | ICD-10-CM

## 2017-08-06 DIAGNOSIS — L84 Corns and callosities: Secondary | ICD-10-CM

## 2017-08-06 DIAGNOSIS — M2041 Other hammer toe(s) (acquired), right foot: Secondary | ICD-10-CM

## 2017-08-06 DIAGNOSIS — M1712 Unilateral primary osteoarthritis, left knee: Secondary | ICD-10-CM

## 2017-08-06 MED ORDER — AZITHROMYCIN 250 MG PO TABS
ORAL_TABLET | ORAL | 0 refills | Status: AC
Start: 2017-08-06 — End: 2017-08-11

## 2017-08-06 MED ORDER — UREA 10 % EX CREA
TOPICAL_CREAM | Freq: Every evening | CUTANEOUS | 2 refills | Status: DC
Start: 2017-08-06 — End: 2018-11-28

## 2017-08-06 NOTE — Progress Notes (Signed)
Decarlo Rivet S Scotlyn Mccranie.M.D.  [facility]  [address]  [place] [address]  Tel. [phone]  Fax [phone]        Date Time:08/06/17 0959  Patient Name: Jamie Sharp, Jamie Sharp   DOB: 21-Oct-1945    Subjective:   Jamie Sharp is a 72 y.o. female who presents for  Follow-up appointment.     Patient Is a Known Case of Obesity, Hypertension, past History of Right Upper Extremity Carpal Tunnel Syndrome. And osteoarthritis left knee    Patient complains of hammertoes on both feet .she has hallux valgus patient planning to have surgery.. Patient complains of calluses some dryness of both feet      Patient Denies Any Chest Pain Orthopnea PND. She Denies Any Muscle Cramps   patient complains of pain left knee on and off for about is manageable   she is advised to get a blood pressure monitor to keep a recording of 4 blood pressure readings  .Marland Kitchen    Past Medical History:     Past Medical History:   Diagnosis Date   . Colon polyps 07/23/2015   . Prediabetes 07/23/2015       Past Surgical History:   No past surgical history on file.    Family History:     Family History   Problem Relation Age of Onset   . No known problems Mother    . No known problems Father    . No known problems Maternal Grandmother    . No known problems Maternal Grandfather    . No known problems Paternal Grandmother    . No known problems Paternal Grandfather        Social History:     Social History     Social History   . Marital status: Divorced     Spouse name: N/A   . Number of children: N/A   . Years of education: N/A     Social History Main Topics   . Smoking status: Never Smoker   . Smokeless tobacco: Never Used   . Alcohol use Not on file   . Drug use: Unknown   . Sexual activity: Not on file     Other Topics Concern   . Not on file     Social History Narrative   . No narrative on file       Allergies:     Allergies   Allergen Reactions   . Amoxicillin Angioedema   . Codeine      hallucination   .  Cortisone        Medications:     Current Outpatient Prescriptions   Medication Sig Dispense Refill   . hydroCHLOROthiazide (HYDRODIURIL) 25 MG tablet TAKE 1 TABLET BY MOUTH EVERY DAY 90 tablet 3     No current facility-administered medications for this visit.        Review of Systems:   A comprehensive review of systems was:   General ROS:   Respiratory ROS: no cough, shortness of breath, or wheezing  Cardiovascular ROS: no chest pain or dyspnea on exertion  Gastrointestinal ROS: no abdominal pain, change in bowel habits, or black or bloody stools  Genito-Urinary ROS:  Complaints of Postmenopausal Bleeding  Neurological ROS: no TIA or stroke symptoms    Physical Exam:     Vitals:    08/06/17 0913   BP: 146/86   Pulse: 85   SpO2: 100%     BP Readings from Last  3 Encounters:   08/06/17 146/86   03/12/17 142/90   12/31/16 (!) 151/101     No Pallor, icterus, clubbing,lymphadenopathy  Neck is supple  No xanthoma , xanthelesma  No carotid bruit  JVP not elevated  No thyroidomegaly  HEENT PEERLA    General appearance - alert, well appearing, and in no distress  Chest - clear to auscultation, no wheezes, rales or rhonchi, symmetric air entry  Heart - normal rate, regular rhythm, normal S1, S2, no murmurs, rubs, clicks or gallops  Abdomen -  No OrganomegalyNeurological - alert, oriented, normal speech, no focal findings or movement disorder noted  Extremities - peripheral pulses normal, no pedal edema, no clubbing or cyanosis   Left and No Wasting of Intervention  Labs:   No results found for this or any previous visit (from the past 2016 hour(s)).    Assessment and Plan:         1. Hypertension .   Her blood pressure is under good control patient is on a small dose of diuretic hydrochlorothiazide 25 mg daily.  No muscle cramps.  Patient has no evidence of left ventricular hypertrophy of renal insufficiency.  She is advised to from salt restricted diet and exercise for weight loss    2.  Colonic polyps status post  polypectomy     3.   Obesity.Body mass index is 37.91 kg/m.   Patient Advised Diet and Exercise Weight Loss Program  4.  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 She has extensive calluses both feet patient put on.. 10% ammonia lotion to apply daily at night patient following with podiatry.  Patient will be cleared for surgery.    Patient Active Problem List   Diagnosis   . HTN (hypertension), benign   . 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 Annual physical exam   . Primary osteoarthritis of left knee       Orders Placed This Encounter   Procedures   . Basic metabolic panel     Order Specific Question:   Has the patient fasted?     Answer:   Yes   . Hepatic function panel (LFT) 7   . Urinalysis     Order Specific Question:   URINE TYPE     Answer:   Clean Catch   . CBC and differential   . TSH   . T3   . Hemoglobin A1C   . T4, free   . Lipid panel     Order Specific Question:   Has the patient fasted?     Answer:   Yes       Signed by: Maida Sale

## 2017-08-08 LAB — COMPREHENSIVE METABOLIC PANEL
ALT: 13 IU/L (ref 0–32)
AST (SGOT): 18 IU/L (ref 0–40)
Albumin/Globulin Ratio: 1.3 (ref 1.2–2.2)
Albumin: 4.3 g/dL (ref 3.5–4.8)
Alkaline Phosphatase: 72 IU/L (ref 39–117)
BUN / Creatinine Ratio: 16 (ref 12–28)
BUN: 14 mg/dL (ref 8–27)
Bilirubin, Total: 0.5 mg/dL (ref 0.0–1.2)
CO2: 26 mmol/L (ref 20–29)
Calcium: 9.8 mg/dL (ref 8.7–10.3)
Chloride: 103 mmol/L (ref 96–106)
Creatinine: 0.88 mg/dL (ref 0.57–1.00)
EGFR: 66 mL/min/{1.73_m2} (ref >59)
EGFR: 76 mL/min/{1.73_m2} (ref >59)
Globulin, Total: 3.2 g/dL (ref 1.5–4.5)
Glucose: 89 mg/dL (ref 65–99)
Potassium: 3.5 mmol/L (ref 3.5–5.2)
Protein, Total: 7.5 g/dL (ref 6.0–8.5)
Sodium: 143 mmol/L (ref 134–144)

## 2017-08-08 LAB — PT/INR
PT INR: 1 (ref 0.8–1.2)
PT: 10.1 s (ref 9.1–12.0)

## 2017-08-19 ENCOUNTER — Ambulatory Visit: Payer: BLUE CROSS/BLUE SHIELD | Admitting: Internal Medicine

## 2017-08-20 ENCOUNTER — Ambulatory Visit: Payer: BLUE CROSS/BLUE SHIELD | Admitting: Internal Medicine

## 2017-08-23 ENCOUNTER — Ambulatory Visit: Payer: BLUE CROSS/BLUE SHIELD | Admitting: Internal Medicine

## 2017-08-24 ENCOUNTER — Ambulatory Visit: Payer: BLUE CROSS/BLUE SHIELD | Admitting: Internal Medicine

## 2017-08-26 ENCOUNTER — Ambulatory Visit: Payer: Medicare Other | Admitting: Internal Medicine

## 2017-08-26 ENCOUNTER — Encounter: Payer: Self-pay | Admitting: Internal Medicine

## 2017-09-23 ENCOUNTER — Encounter: Payer: Self-pay | Admitting: Internal Medicine

## 2017-12-31 ENCOUNTER — Encounter: Payer: Self-pay | Admitting: Internal Medicine

## 2017-12-31 ENCOUNTER — Ambulatory Visit: Payer: Medicare Other | Admitting: Internal Medicine

## 2017-12-31 DIAGNOSIS — M2041 Other hammer toe(s) (acquired), right foot: Secondary | ICD-10-CM

## 2017-12-31 DIAGNOSIS — I10 Essential (primary) hypertension: Secondary | ICD-10-CM

## 2017-12-31 DIAGNOSIS — M2042 Other hammer toe(s) (acquired), left foot: Secondary | ICD-10-CM

## 2017-12-31 DIAGNOSIS — G5692 Unspecified mononeuropathy of left upper limb: Secondary | ICD-10-CM | POA: Insufficient documentation

## 2017-12-31 MED ORDER — GABAPENTIN 100 MG PO CAPS
100.00 mg | ORAL_CAPSULE | Freq: Three times a day (TID) | ORAL | 2 refills | Status: AC
Start: 2017-12-31 — End: 2018-03-31

## 2018-01-02 MED ORDER — HYDROCHLOROTHIAZIDE 25 MG PO TABS
25.00 mg | ORAL_TABLET | Freq: Every day | ORAL | 3 refills | Status: DC
Start: 2018-01-02 — End: 2019-02-08

## 2018-01-02 NOTE — Progress Notes (Signed)
Layci Stenglein S Jylan Loeza.M.D.  [facility]  [address]  [place] [address]  Tel. [phone]  Fax [phone]    Date Time:12/31/17 1101  Patient Name: Jamie Sharp, Jamie Sharp   DOB: 21-Jul-1945    Subjective:   Retta Diones Estefani Bateson is a 72 y.o. female who presents for  Follow-up appointment.     Patient Is a Known Case of Obesity, Hypertension, past History of  Carpal Tunnel Syndrome. And osteoarthritis left knee  Patient wants to see a neurologist patient complains of tingling numbness left upper extremity.  Patient has to hang left arm by the bed while sleeping and also shake her hand.  She complains of numbness of the whole hand up to the forearm.  Patient thinks she had nerve damage when she had phlebotomy left arm 2 years ago.  Patient saw surgeon who said she should need to see neurologist.  Patient denies any neck pain radicular symptoms.  Patient denies any trauma to the left elbow.  No obvious hematoma.  Patient denies any weakness in the grip or wasting of interossei.  However symptoms are suggestive of left carpal tunnel syndrome.  Patient advised to get EMG nerve conduction.  After seeing the results she will be referred to neurologist or surgeon for possible decompression.  Symptoms do not suggestive of any median nerve entrapment at shoulder.  Patient complains of hammertoes on both feet .she has hallux valgus patient planning to have surgery.. Patient complains of calluses some dryness of both feet      Patient Denies Any Chest Pain Orthopnea PND. She Denies Any Muscle Cramps   patient complains of pain left knee on and off for about is manageable   she is advised to get a blood pressure monitor to keep a recording of 4 blood pressure readings  .Marland Kitchen    Past Medical History:     Past Medical History:   Diagnosis Date   . Colon polyps 07/23/2015   . Prediabetes 07/23/2015       Past Surgical History:   History reviewed. No pertinent surgical history.    Family History:      Family History   Problem Relation Age of Onset   . No known problems Mother    . No known problems Father    . No known problems Maternal Grandmother    . No known problems Maternal Grandfather    . No known problems Paternal Grandmother    . No known problems Paternal Grandfather        Social History:     Social History     Socioeconomic History   . Marital status: Divorced     Spouse name: None   . Number of children: None   . Years of education: None   . Highest education level: None   Occupational History   . None   Social Needs   . Financial resource strain: None   . Food insecurity:     Worry: None     Inability: None   . Transportation needs:     Medical: None     Non-medical: None   Tobacco Use   . Smoking status: Never Smoker   . Smokeless tobacco: Never Used   Substance and Sexual Activity   . Alcohol use: None   . Drug use: None   . Sexual activity: None   Lifestyle   . Physical activity:     Days per week: None  Minutes per session: None   . Stress: None   Relationships   . Social connections:     Talks on phone: None     Gets together: None     Attends religious service: None     Active member of club or organization: None     Attends meetings of clubs or organizations: None     Relationship status: None   . Intimate partner violence:     Fear of current or ex partner: None     Emotionally abused: None     Physically abused: None     Forced sexual activity: None   Other Topics Concern   . None   Social History Narrative   . None       Allergies:     Allergies   Allergen Reactions   . Amoxicillin Angioedema   . Codeine      hallucination   . Cortisone        Medications:     Current Outpatient Medications   Medication Sig Dispense Refill   . gabapentin (NEURONTIN) 100 MG capsule Take 1 capsule (100 mg total) by mouth 3 (three) times daily 90 capsule 2   . hydroCHLOROthiazide (HYDRODIURIL) 25 MG tablet Take 1 tablet (25 mg total) by mouth daily 90 tablet 3   . urea (CARMOL) 10 % cream Apply  topically nightly 142 g 2     No current facility-administered medications for this visit.        Review of Systems:   A comprehensive review of systems was:   General ROS:   Respiratory ROS: no cough, shortness of breath, or wheezing  Cardiovascular ROS: no chest pain or dyspnea on exertion  Gastrointestinal ROS: no abdominal pain, change in bowel habits, or black or bloody stools  Genito-Urinary ROS:  Complaints of Postmenopausal Bleeding  Neurological ROS: Patient complains of tingling numbness left upper extremity involving the whole hand.  No weakness in the grip.  Patient has to handle and shake her hand to decrease numbness.  She denies pain.  No trauma. no TIA or stroke symptoms    Physical Exam:     Vitals:    12/31/17 1101   BP: (!) 137/91   Pulse: 98   SpO2: 99%     BP Readings from Last 3 Encounters:   12/31/17 (!) 137/91   08/06/17 146/86   03/12/17 142/90     No Pallor, icterus, clubbing,lymphadenopathy  Neck is supple  No xanthoma , xanthelesma  No carotid bruit  JVP not elevated  No thyroidomegaly  HEENT PEERLA    General appearance - alert, well appearing, and in no distress  Chest - clear to auscultation, no wheezes, rales or rhonchi, symmetric air entry  Heart - normal rate, regular rhythm, normal S1, S2, no murmurs, rubs, clicks or gallops  Abdomen -  No OrganomegalyNeurological - alert, oriented, normal speech, no focal findings or movement disorder noted  Extremities - peripheral pulses normal, no pedal edema, no clubbing or cyanosis   Left left upper extremity.  No wasting of interossei grip is good Tinel signs positive.  Patient has hypoesthesia.  Symptoms are suggestive of carpal tunnel left elbow movements are free no mass no lipoma  No hematoma.  Labs:   No results found for this or any previous visit (from the past 2016 hour(s)).    Assessment and Plan:         1. Hypertension .   Her blood pressure is under  good control patient is on a small dose of diuretic hydrochlorothiazide 25 mg  daily.  No muscle cramps.  Patient has no evidence of left ventricular hypertrophy of renal insufficiency.  She is advised to from salt restricted diet and exercise for weight loss    2.  Colonic polyps status post polypectomy     3.   Obesity.Body mass index is 37.91 kg/m.   Patient Advised Diet and Exercise Weight Loss Program  4.  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 She has extensive calluses both feet patient put on.. 10% ammonia lotion to apply daily at night patient following with podiatry.  Patient will be cleared for surgery.  5 left upper extremity weakness numbness.  Predominantly involving all fingers of the left hand.  Patient symptoms suggestive of carpal tunnel or entrapment neuropathy.  Patient says she has phlebotomy 2 years ago and she had hematoma.  She thinks her symptoms are related to venipuncture 2 years ago.  Patient informed her symptoms are suggestive of carpal tunnel or median nerve entrapment or cervical radiculopathy patient advised to get EMG nerve conduction.  And will refer to neurology as she desires after reviewing the nerve conduction.    Patient Active Problem List   Diagnosis   . HTN (hypertension), benign   . Carpal tunnel syndrome of left wrist   . Primary osteoarthritis of left knee   . Valgus deformity of both great toes   . Hammer toes, bilateral   . Pre-ulcerative corn or callous   . Entrapment neuropathy of upper extremity, left       Orders Placed This Encounter   Procedures   . Nerve conduction test     Standing Status:   Future     Standing Expiration Date:   01/01/2019     Order Specific Question:   Reason for Exam     Answer:   tingling numbness of left hand with weakness. entrapment neuropathy.       Signed by: Maida Sale

## 2018-01-03 ENCOUNTER — Encounter: Payer: Self-pay | Admitting: Internal Medicine

## 2018-01-27 ENCOUNTER — Encounter: Payer: Self-pay | Admitting: Internal Medicine

## 2018-01-31 ENCOUNTER — Other Ambulatory Visit: Payer: Self-pay | Admitting: Internal Medicine

## 2018-01-31 DIAGNOSIS — N6009 Solitary cyst of unspecified breast: Secondary | ICD-10-CM

## 2018-01-31 NOTE — Telephone Encounter (Signed)
Us/

## 2018-02-03 ENCOUNTER — Encounter: Payer: Self-pay | Admitting: Internal Medicine

## 2018-02-04 ENCOUNTER — Encounter: Payer: Self-pay | Admitting: Internal Medicine

## 2018-03-21 ENCOUNTER — Encounter: Payer: Self-pay | Admitting: Internal Medicine

## 2018-03-21 ENCOUNTER — Ambulatory Visit: Payer: Medicare Other | Admitting: Internal Medicine

## 2018-03-21 VITALS — BP 176/91 | HR 106 | Ht 63.0 in | Wt 214.0 lb

## 2018-03-21 DIAGNOSIS — M2042 Other hammer toe(s) (acquired), left foot: Secondary | ICD-10-CM

## 2018-03-21 DIAGNOSIS — M2041 Other hammer toe(s) (acquired), right foot: Secondary | ICD-10-CM

## 2018-03-21 DIAGNOSIS — M1712 Unilateral primary osteoarthritis, left knee: Secondary | ICD-10-CM

## 2018-03-21 DIAGNOSIS — G5692 Unspecified mononeuropathy of left upper limb: Secondary | ICD-10-CM

## 2018-03-21 DIAGNOSIS — G5602 Carpal tunnel syndrome, left upper limb: Secondary | ICD-10-CM

## 2018-03-21 DIAGNOSIS — I10 Essential (primary) hypertension: Secondary | ICD-10-CM

## 2018-03-21 MED ORDER — IBUPROFEN 600 MG PO TABS
600.00 mg | ORAL_TABLET | Freq: Four times a day (QID) | ORAL | 2 refills | Status: DC | PRN
Start: 2018-03-21 — End: 2018-11-28

## 2018-03-21 MED ORDER — BACLOFEN 20 MG PO TABS
20.00 mg | ORAL_TABLET | Freq: Three times a day (TID) | ORAL | 3 refills | Status: DC
Start: 2018-03-21 — End: 2018-11-28

## 2018-03-21 MED ORDER — PANTOPRAZOLE SODIUM 40 MG PO TBEC
40.00 mg | DELAYED_RELEASE_TABLET | Freq: Every day | ORAL | 1 refills | Status: AC
Start: 2018-03-21 — End: 2018-05-20
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
## 2018-05-12 ENCOUNTER — Encounter: Payer: Self-pay | Admitting: Internal Medicine

## 2018-10-20 ENCOUNTER — Ambulatory Visit: Payer: Auto Insurance (includes no fault) | Admitting: Internal Medicine

## 2018-10-20 ENCOUNTER — Encounter: Payer: Self-pay | Admitting: Internal Medicine

## 2018-10-20 DIAGNOSIS — M542 Cervicalgia: Secondary | ICD-10-CM

## 2018-10-20 MED ORDER — CYCLOBENZAPRINE HCL 5 MG PO TABS
5.0000 mg | ORAL_TABLET | Freq: Three times a day (TID) | ORAL | 0 refills | Status: DC | PRN
Start: 2018-10-20 — End: 2018-11-28
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
## 2018-11-04 ENCOUNTER — Other Ambulatory Visit: Payer: Self-pay | Admitting: Internal Medicine

## 2018-11-22 ENCOUNTER — Ambulatory Visit: Payer: Medicare Other | Admitting: Internal Medicine

## 2018-11-28 ENCOUNTER — Ambulatory Visit: Payer: Medicare Other | Admitting: Internal Medicine

## 2018-11-28 ENCOUNTER — Encounter: Payer: Self-pay | Admitting: Internal Medicine

## 2018-11-28 VITALS — BP 153/92 | HR 87 | Ht 63.0 in | Wt 220.0 lb

## 2018-11-28 DIAGNOSIS — N6019 Diffuse cystic mastopathy of unspecified breast: Secondary | ICD-10-CM

## 2018-11-28 DIAGNOSIS — Z0000 Encounter for general adult medical examination without abnormal findings: Secondary | ICD-10-CM

## 2018-11-28 DIAGNOSIS — M2041 Other hammer toe(s) (acquired), right foot: Secondary | ICD-10-CM

## 2018-11-28 DIAGNOSIS — Z6838 Body mass index (BMI) 38.0-38.9, adult: Secondary | ICD-10-CM

## 2018-11-28 DIAGNOSIS — E6609 Other obesity due to excess calories: Secondary | ICD-10-CM

## 2018-11-28 DIAGNOSIS — I10 Essential (primary) hypertension: Secondary | ICD-10-CM

## 2018-11-28 DIAGNOSIS — G5692 Unspecified mononeuropathy of left upper limb: Secondary | ICD-10-CM

## 2018-11-28 DIAGNOSIS — M2011 Hallux valgus (acquired), right foot: Secondary | ICD-10-CM

## 2018-11-28 DIAGNOSIS — M2012 Hallux valgus (acquired), left foot: Secondary | ICD-10-CM

## 2018-11-28 DIAGNOSIS — M2042 Other hammer toe(s) (acquired), left foot: Secondary | ICD-10-CM

## 2018-11-28 DIAGNOSIS — G5602 Carpal tunnel syndrome, left upper limb: Secondary | ICD-10-CM

## 2018-11-28 DIAGNOSIS — M1712 Unilateral primary osteoarthritis, left knee: Secondary | ICD-10-CM

## 2018-11-28 MED ORDER — CYCLOBENZAPRINE HCL 5 MG PO TABS
5.0000 mg | ORAL_TABLET | Freq: Three times a day (TID) | ORAL | 4 refills | Status: DC | PRN
Start: 2018-11-28 — End: 2019-01-16
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
## 2018-12-06 LAB — HEPATIC FUNCTION PANEL
ALT: 20 IU/L (ref 0–32)
AST (SGOT): 21 IU/L (ref 0–40)
Albumin: 4.2 g/dL (ref 3.7–4.7)
Alkaline Phosphatase: 101 IU/L (ref 39–117)
Bilirubin Direct: 0.17 mg/dL (ref 0.00–0.40)
Bilirubin, Total: 0.6 mg/dL (ref 0.0–1.2)
Protein, Total: 7.3 g/dL (ref 6.0–8.5)

## 2018-12-06 LAB — BASIC METABOLIC PANEL
BUN / Creatinine Ratio: 15 (ref 12–28)
BUN: 12 mg/dL (ref 8–27)
CO2: 23 mmol/L (ref 20–29)
Calcium: 9.7 mg/dL (ref 8.7–10.3)
Chloride: 103 mmol/L (ref 96–106)
Creatinine: 0.79 mg/dL (ref 0.57–1.00)
EGFR: 75 mL/min/{1.73_m2} (ref >59)
EGFR: 86 mL/min/{1.73_m2} (ref >59)
Glucose: 75 mg/dL (ref 65–99)
Potassium: 4 mmol/L (ref 3.5–5.2)
Sodium: 144 mmol/L (ref 134–144)

## 2018-12-06 LAB — LIPID PANEL
Cholesterol / HDL Ratio: 2.3 ratio (ref 0.0–4.4)
Cholesterol: 190 mg/dL (ref 100–199)
HDL: 84 mg/dL (ref >39)
LDL Chol Calculated (NIH): 93 mg/dL (ref 0–99)
Triglycerides: 74 mg/dL (ref 0–149)
VLDL Calculated: 13 mg/dL (ref 5–40)

## 2018-12-06 LAB — CBC AND DIFFERENTIAL
Baso(Absolute): 0 10*3/uL (ref 0.0–0.2)
Basos: 0 %
Eos: 0 %
Eosinophils Absolute: 0 10*3/uL (ref 0.0–0.4)
Hematocrit: 38.7 % (ref 34.0–46.6)
Hemoglobin: 12.6 g/dL (ref 11.1–15.9)
Immature Granulocytes Absolute: 0 10*3/uL (ref 0.0–0.1)
Immature Granulocytes: 0 %
Lymphocytes Absolute: 3.1 10*3/uL (ref 0.7–3.1)
Lymphocytes: 31 %
MCH: 27.9 pg (ref 26.6–33.0)
MCHC: 32.6 g/dL (ref 31.5–35.7)
MCV: 86 fL (ref 79–97)
Monocytes Absolute: 1 10*3/uL — ABNORMAL HIGH (ref 0.1–0.9)
Monocytes: 10 %
Neutrophils Absolute: 5.8 10*3/uL (ref 1.4–7.0)
Neutrophils: 59 %
Platelets: 350 10*3/uL (ref 150–450)
RBC: 4.51 x10E6/uL (ref 3.77–5.28)
RDW: 12 % (ref 11.7–15.4)
WBC: 10 10*3/uL (ref 3.4–10.8)

## 2018-12-06 LAB — TSH: TSH: 0.434 u[IU]/mL — ABNORMAL LOW (ref 0.450–4.500)

## 2018-12-06 LAB — HEMOGLOBIN A1C: Hemoglobin A1C: 5.8 % — ABNORMAL HIGH (ref 4.8–5.6)

## 2018-12-06 LAB — VITAMIN D,25 OH,TOTAL: Vitamin D 25-Hydroxy: <4 ng/mL — ABNORMAL LOW (ref 30.0–100.0)

## 2019-01-16 ENCOUNTER — Telehealth: Payer: Medicare Other | Admitting: Internal Medicine

## 2019-01-16 DIAGNOSIS — G5602 Carpal tunnel syndrome, left upper limb: Secondary | ICD-10-CM

## 2019-01-16 DIAGNOSIS — R7989 Other specified abnormal findings of blood chemistry: Secondary | ICD-10-CM

## 2019-01-16 DIAGNOSIS — M47812 Spondylosis without myelopathy or radiculopathy, cervical region: Secondary | ICD-10-CM

## 2019-01-16 DIAGNOSIS — G5692 Unspecified mononeuropathy of left upper limb: Secondary | ICD-10-CM

## 2019-01-16 DIAGNOSIS — M1712 Unilateral primary osteoarthritis, left knee: Secondary | ICD-10-CM

## 2019-01-16 DIAGNOSIS — I10 Essential (primary) hypertension: Secondary | ICD-10-CM

## 2019-01-16 MED ORDER — PANTOPRAZOLE SODIUM 40 MG PO TBEC
40.00 mg | DELAYED_RELEASE_TABLET | Freq: Every day | ORAL | 1 refills | Status: DC
Start: 2019-01-16 — End: 2019-02-07

## 2019-01-16 MED ORDER — CYCLOBENZAPRINE HCL 10 MG PO TABS
10.0000 mg | ORAL_TABLET | Freq: Three times a day (TID) | ORAL | 1 refills | Status: DC | PRN
Start: 2019-01-16 — End: 2019-02-26

## 2019-01-16 MED ORDER — IBUPROFEN 600 MG PO TABS
600.0000 mg | ORAL_TABLET | Freq: Three times a day (TID) | ORAL | 1 refills | Status: DC | PRN
Start: 2019-01-16 — End: 2021-06-26
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
## 2019-01-23 DIAGNOSIS — M47812 Spondylosis without myelopathy or radiculopathy, cervical region: Secondary | ICD-10-CM | POA: Insufficient documentation

## 2019-02-07 ENCOUNTER — Other Ambulatory Visit: Payer: Self-pay | Admitting: Internal Medicine

## 2019-02-08 ENCOUNTER — Other Ambulatory Visit: Payer: Self-pay | Admitting: Internal Medicine

## 2019-02-25 ENCOUNTER — Other Ambulatory Visit: Payer: Self-pay | Admitting: Internal Medicine

## 2019-03-10 ENCOUNTER — Telehealth: Payer: Medicare Other | Admitting: Internal Medicine

## 2019-03-10 DIAGNOSIS — J3089 Other allergic rhinitis: Secondary | ICD-10-CM

## 2019-03-10 DIAGNOSIS — I10 Essential (primary) hypertension: Secondary | ICD-10-CM

## 2019-03-10 MED ORDER — AZITHROMYCIN 250 MG PO TABS
ORAL_TABLET | ORAL | 0 refills | Status: AC
Start: 2019-03-10 — End: 2019-03-15

## 2019-03-11 DIAGNOSIS — J3089 Other allergic rhinitis: Secondary | ICD-10-CM | POA: Insufficient documentation
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
## 2019-03-27 ENCOUNTER — Ambulatory Visit: Payer: Medicare Other | Admitting: Internal Medicine

## 2019-04-24 ENCOUNTER — Telehealth: Payer: Medicare Other | Admitting: Internal Medicine

## 2019-04-27 ENCOUNTER — Ambulatory Visit: Payer: Medicare Other | Admitting: Internal Medicine

## 2019-04-27 ENCOUNTER — Encounter: Payer: Self-pay | Admitting: Internal Medicine

## 2019-05-02 ENCOUNTER — Ambulatory Visit: Payer: Medicare Other | Admitting: Internal Medicine

## 2019-05-09 ENCOUNTER — Ambulatory Visit: Payer: Medicare Other | Admitting: Internal Medicine

## 2019-08-07 ENCOUNTER — Ambulatory Visit: Payer: Medicare Other | Admitting: Internal Medicine

## 2019-08-07 ENCOUNTER — Encounter: Payer: Self-pay | Admitting: Internal Medicine

## 2019-08-07 VITALS — BP 182/108 | HR 103 | Temp 97.0°F | Ht 63.0 in | Wt 223.0 lb

## 2019-08-07 DIAGNOSIS — Z6839 Body mass index (BMI) 39.0-39.9, adult: Secondary | ICD-10-CM

## 2019-08-07 DIAGNOSIS — G5602 Carpal tunnel syndrome, left upper limb: Secondary | ICD-10-CM

## 2019-08-07 DIAGNOSIS — E6609 Other obesity due to excess calories: Secondary | ICD-10-CM

## 2019-08-07 DIAGNOSIS — I10 Essential (primary) hypertension: Secondary | ICD-10-CM

## 2019-08-07 MED ORDER — VITAMIN D (ERGOCALCIFEROL) 1.25 MG (50000 UT) PO CAPS
50000.00 [IU] | ORAL_CAPSULE | ORAL | 1 refills | Status: DC
Start: 2019-08-07 — End: 2020-01-21
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
## 2019-08-27 ENCOUNTER — Other Ambulatory Visit: Payer: Self-pay | Admitting: Internal Medicine

## 2019-09-18 ENCOUNTER — Encounter: Payer: Self-pay | Admitting: Internal Medicine

## 2019-09-18 ENCOUNTER — Ambulatory Visit: Payer: Medicare Other | Admitting: Internal Medicine

## 2019-09-18 VITALS — BP 174/98 | HR 108 | Temp 97.7°F | Ht 63.0 in | Wt 233.0 lb

## 2019-09-18 DIAGNOSIS — M2041 Other hammer toe(s) (acquired), right foot: Secondary | ICD-10-CM

## 2019-09-18 DIAGNOSIS — I10 Essential (primary) hypertension: Secondary | ICD-10-CM

## 2019-09-18 DIAGNOSIS — G5602 Carpal tunnel syndrome, left upper limb: Secondary | ICD-10-CM

## 2019-09-18 DIAGNOSIS — M2042 Other hammer toe(s) (acquired), left foot: Secondary | ICD-10-CM

## 2019-09-18 DIAGNOSIS — Z6839 Body mass index (BMI) 39.0-39.9, adult: Secondary | ICD-10-CM

## 2019-09-18 DIAGNOSIS — E6609 Other obesity due to excess calories: Secondary | ICD-10-CM
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
## 2019-11-07 ENCOUNTER — Ambulatory Visit: Payer: Medicare Other | Admitting: Internal Medicine

## 2019-11-22 ENCOUNTER — Encounter: Payer: Medicare Other | Admitting: Internal Medicine

## 2019-12-05 ENCOUNTER — Ambulatory Visit: Payer: Medicare Other | Admitting: Internal Medicine

## 2019-12-26 ENCOUNTER — Encounter: Payer: Medicare Other | Admitting: Internal Medicine

## 2020-01-04 ENCOUNTER — Encounter: Payer: Medicare Other | Admitting: Internal Medicine

## 2020-01-21 ENCOUNTER — Other Ambulatory Visit: Payer: Self-pay | Admitting: Internal Medicine

## 2020-01-23 ENCOUNTER — Telehealth: Payer: Self-pay | Admitting: Internal Medicine

## 2020-01-23 DIAGNOSIS — N6012 Diffuse cystic mastopathy of left breast: Secondary | ICD-10-CM

## 2020-01-23 DIAGNOSIS — N6002 Solitary cyst of left breast: Secondary | ICD-10-CM

## 2020-01-23 NOTE — Telephone Encounter (Signed)
On vitamin d .wants diagnostic mammogram

## 2020-02-19 ENCOUNTER — Other Ambulatory Visit: Payer: Self-pay | Admitting: Internal Medicine

## 2020-02-19 DIAGNOSIS — I10 Essential (primary) hypertension: Secondary | ICD-10-CM

## 2020-02-19 DIAGNOSIS — Z0000 Encounter for general adult medical examination without abnormal findings: Secondary | ICD-10-CM

## 2020-02-19 DIAGNOSIS — E559 Vitamin D deficiency, unspecified: Secondary | ICD-10-CM

## 2020-02-19 DIAGNOSIS — R7303 Prediabetes: Secondary | ICD-10-CM

## 2020-02-27 ENCOUNTER — Other Ambulatory Visit: Payer: Self-pay | Admitting: Internal Medicine

## 2020-02-28 LAB — LIPID PANEL
Cholesterol / HDL Ratio: 2.3 ratio (ref 0.0–4.4)
Cholesterol: 201 mg/dL — ABNORMAL HIGH (ref 100–199)
HDL: 88 mg/dL (ref >39)
LDL Chol Calculated (NIH): 103 mg/dL — ABNORMAL HIGH (ref 0–99)
Triglycerides: 53 mg/dL (ref 0–149)
VLDL Calculated: 10 mg/dL (ref 5–40)

## 2020-02-28 LAB — CBC AND DIFFERENTIAL
Baso(Absolute): 0 10*3/uL (ref 0.0–0.2)
Basos: 0 %
Eos: 1 %
Eosinophils Absolute: 0.1 10*3/uL (ref 0.0–0.4)
Hematocrit: 41.9 % (ref 34.0–46.6)
Hemoglobin: 13.6 g/dL (ref 11.1–15.9)
Immature Granulocytes Absolute: 0 10*3/uL (ref 0.0–0.1)
Immature Granulocytes: 0 %
Lymphocytes Absolute: 3.2 10*3/uL — ABNORMAL HIGH (ref 0.7–3.1)
Lymphocytes: 36 %
MCH: 28.4 pg (ref 26.6–33.0)
MCHC: 32.5 g/dL (ref 31.5–35.7)
MCV: 88 fL (ref 79–97)
Monocytes Absolute: 0.6 10*3/uL (ref 0.1–0.9)
Monocytes: 7 %
Neutrophils Absolute: 4.9 10*3/uL (ref 1.4–7.0)
Neutrophils: 56 %
Platelets: 370 10*3/uL (ref 150–450)
RBC: 4.79 x10E6/uL (ref 3.77–5.28)
RDW: 11.7 % (ref 11.7–15.4)
WBC: 8.8 10*3/uL (ref 3.4–10.8)

## 2020-02-28 LAB — HEPATIC FUNCTION PANEL
ALT: 19 IU/L (ref 0–32)
AST (SGOT): 17 IU/L (ref 0–40)
Albumin: 4.3 g/dL (ref 3.7–4.7)
Alkaline Phosphatase: 94 IU/L (ref 44–121)
Bilirubin Direct: 0.12 mg/dL (ref 0.00–0.40)
Bilirubin, Total: 0.5 mg/dL (ref 0.0–1.2)
Protein, Total: 7.8 g/dL (ref 6.0–8.5)

## 2020-02-28 LAB — BASIC METABOLIC PANEL
African American eGFR: 78 mL/min/{1.73_m2} (ref >59)
BUN / Creatinine Ratio: 15 (ref 12–28)
BUN: 13 mg/dL (ref 8–27)
CO2: 25 mmol/L (ref 20–29)
Calcium: 10.4 mg/dL — ABNORMAL HIGH (ref 8.7–10.3)
Chloride: 101 mmol/L (ref 96–106)
Creatinine: 0.86 mg/dL (ref 0.57–1.00)
Glucose: 110 mg/dL — ABNORMAL HIGH (ref 65–99)
Potassium: 3.8 mmol/L (ref 3.5–5.2)
Sodium: 141 mmol/L (ref 134–144)
non-African American eGFR: 67 mL/min/{1.73_m2} (ref >59)

## 2020-02-28 LAB — URINALYSIS
Bilirubin, UA: NEGATIVE
Blood, UA: NEGATIVE
Glucose, UA: NEGATIVE
Leukocyte Esterase, UA: NEGATIVE
Nitrite, UA: NEGATIVE
Protein, UA: NEGATIVE
Urine Specific Gravity: 1.021 (ref 1.005–1.030)
Urobilinogen, Ur: 0.2 mg/dL (ref 0.2–1.0)
pH, UA: 6 (ref 5.0–7.5)

## 2020-02-28 LAB — HEMOGLOBIN A1C: Hemoglobin A1C: 5.9 % — ABNORMAL HIGH (ref 4.8–5.6)

## 2020-02-28 LAB — TSH: TSH: 0.409 u[IU]/mL — ABNORMAL LOW (ref 0.450–4.500)

## 2020-02-28 LAB — VITAMIN D,25 OH,TOTAL: Vitamin D 25-Hydroxy: 34.7 ng/mL (ref 30.0–100.0)

## 2020-04-22 ENCOUNTER — Telehealth: Payer: Medicare Other | Admitting: Internal Medicine

## 2020-04-22 DIAGNOSIS — E6609 Other obesity due to excess calories: Secondary | ICD-10-CM

## 2020-04-22 DIAGNOSIS — Z6839 Body mass index (BMI) 39.0-39.9, adult: Secondary | ICD-10-CM

## 2020-04-22 DIAGNOSIS — R7989 Other specified abnormal findings of blood chemistry: Secondary | ICD-10-CM

## 2020-04-22 DIAGNOSIS — I10 Essential (primary) hypertension: Secondary | ICD-10-CM

## 2020-04-22 DIAGNOSIS — J0100 Acute maxillary sinusitis, unspecified: Secondary | ICD-10-CM | POA: Insufficient documentation

## 2020-04-22 DIAGNOSIS — J3089 Other allergic rhinitis: Secondary | ICD-10-CM

## 2020-04-22 MED ORDER — LORATADINE 10 MG PO TABS
10.0000 mg | ORAL_TABLET | Freq: Every day | ORAL | 3 refills | Status: DC
Start: 2020-04-22 — End: 2020-08-06

## 2020-04-22 MED ORDER — FLUTICASONE PROPIONATE 50 MCG/ACT NA SUSP
1.0000 | Freq: Every day | NASAL | 3 refills | Status: AC
Start: 2020-04-22 — End: 2021-04-22

## 2020-04-22 MED ORDER — AZITHROMYCIN 250 MG PO TABS
ORAL_TABLET | ORAL | 0 refills | Status: AC
Start: 2020-04-22 — End: 2020-04-27
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
## 2020-07-10 ENCOUNTER — Other Ambulatory Visit: Payer: Self-pay | Admitting: Internal Medicine

## 2020-07-10 ENCOUNTER — Encounter: Payer: Self-pay | Admitting: Internal Medicine

## 2020-07-17 ENCOUNTER — Ambulatory Visit: Payer: Medicare Other | Admitting: Internal Medicine

## 2020-07-19 ENCOUNTER — Ambulatory Visit: Payer: Medicare Other | Admitting: Internal Medicine

## 2020-07-25 ENCOUNTER — Ambulatory Visit: Payer: Medicare Other | Admitting: Internal Medicine

## 2020-08-01 ENCOUNTER — Encounter: Payer: Self-pay | Admitting: Internal Medicine

## 2020-08-01 ENCOUNTER — Ambulatory Visit: Payer: Medicare Other | Admitting: Internal Medicine

## 2020-08-01 VITALS — BP 174/101 | HR 102 | Temp 97.6°F | Ht 63.0 in | Wt 230.0 lb

## 2020-08-01 DIAGNOSIS — R600 Localized edema: Secondary | ICD-10-CM

## 2020-08-01 DIAGNOSIS — I10 Essential (primary) hypertension: Secondary | ICD-10-CM

## 2020-08-01 HISTORY — DX: Localized edema: R60.0

## 2020-08-01 MED ORDER — FUROSEMIDE 40 MG PO TABS
ORAL_TABLET | ORAL | 1 refills | Status: DC
Start: 2020-08-01 — End: 2020-08-06

## 2020-08-01 MED ORDER — VALSARTAN-HYDROCHLOROTHIAZIDE 160-25 MG PO TABS
1.0000 | ORAL_TABLET | Freq: Every day | ORAL | 0 refills | Status: DC
Start: 2020-08-01 — End: 2020-08-06
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
## 2020-08-06 ENCOUNTER — Ambulatory Visit: Payer: Medicare Other | Admitting: Internal Medicine

## 2020-08-06 ENCOUNTER — Encounter: Payer: Self-pay | Admitting: Internal Medicine

## 2020-08-06 VITALS — BP 140/90 | HR 99 | Temp 97.7°F | Ht 63.0 in | Wt 222.0 lb

## 2020-08-06 DIAGNOSIS — M1712 Unilateral primary osteoarthritis, left knee: Secondary | ICD-10-CM

## 2020-08-06 DIAGNOSIS — G5692 Unspecified mononeuropathy of left upper limb: Secondary | ICD-10-CM

## 2020-08-06 DIAGNOSIS — M2011 Hallux valgus (acquired), right foot: Secondary | ICD-10-CM

## 2020-08-06 DIAGNOSIS — G5602 Carpal tunnel syndrome, left upper limb: Secondary | ICD-10-CM

## 2020-08-06 DIAGNOSIS — M2012 Hallux valgus (acquired), left foot: Secondary | ICD-10-CM

## 2020-08-06 DIAGNOSIS — I10 Essential (primary) hypertension: Secondary | ICD-10-CM

## 2020-08-06 DIAGNOSIS — E6609 Other obesity due to excess calories: Secondary | ICD-10-CM

## 2020-08-06 DIAGNOSIS — Z6839 Body mass index (BMI) 39.0-39.9, adult: Secondary | ICD-10-CM

## 2020-08-06 MED ORDER — HYDROCHLOROTHIAZIDE 25 MG PO TABS
25.0000 mg | ORAL_TABLET | Freq: Every day | ORAL | 3 refills | Status: DC
Start: 2020-08-06 — End: 2021-09-25
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
## 2020-08-15 ENCOUNTER — Ambulatory Visit: Payer: Medicare Other | Admitting: Internal Medicine

## 2020-08-15 VITALS — BP 150/88 | HR 96 | Temp 97.7°F | Ht 63.0 in | Wt 220.0 lb

## 2020-08-15 DIAGNOSIS — R7303 Prediabetes: Secondary | ICD-10-CM

## 2020-08-15 DIAGNOSIS — M2012 Hallux valgus (acquired), left foot: Secondary | ICD-10-CM

## 2020-08-15 DIAGNOSIS — I10 Essential (primary) hypertension: Secondary | ICD-10-CM

## 2020-08-15 DIAGNOSIS — E6609 Other obesity due to excess calories: Secondary | ICD-10-CM

## 2020-08-15 DIAGNOSIS — R600 Localized edema: Secondary | ICD-10-CM

## 2020-08-15 DIAGNOSIS — Z6839 Body mass index (BMI) 39.0-39.9, adult: Secondary | ICD-10-CM

## 2020-08-15 DIAGNOSIS — E8881 Metabolic syndrome: Secondary | ICD-10-CM

## 2020-08-15 DIAGNOSIS — M1712 Unilateral primary osteoarthritis, left knee: Secondary | ICD-10-CM

## 2020-08-15 DIAGNOSIS — M2011 Hallux valgus (acquired), right foot: Secondary | ICD-10-CM

## 2020-08-15 MED ORDER — DULAGLUTIDE 1.5 MG/0.5ML SC SOPN
1.5000 mg | PEN_INJECTOR | SUBCUTANEOUS | 1 refills | Status: DC
Start: 2020-08-15 — End: 2021-06-26

## 2020-08-19 ENCOUNTER — Other Ambulatory Visit: Payer: Self-pay | Admitting: Internal Medicine

## 2020-08-19 DIAGNOSIS — E8881 Metabolic syndrome: Secondary | ICD-10-CM | POA: Insufficient documentation
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
## 2020-08-27 ENCOUNTER — Encounter: Payer: Medicare Other | Admitting: Internal Medicine

## 2020-08-27 DIAGNOSIS — Z111 Encounter for screening for respiratory tuberculosis: Secondary | ICD-10-CM

## 2020-08-29 LAB — TB SKIN TEST
Induration: 0
TB Skin Test: NEGATIVE mm

## 2020-09-03 ENCOUNTER — Encounter: Payer: Self-pay | Admitting: Internal Medicine

## 2020-11-04 ENCOUNTER — Other Ambulatory Visit: Payer: Self-pay | Admitting: Internal Medicine

## 2020-11-04 DIAGNOSIS — I5082 Biventricular heart failure: Secondary | ICD-10-CM

## 2020-11-05 ENCOUNTER — Other Ambulatory Visit: Payer: Self-pay | Admitting: Internal Medicine

## 2021-01-15 ENCOUNTER — Encounter: Payer: Self-pay | Admitting: Internal Medicine

## 2021-01-15 ENCOUNTER — Other Ambulatory Visit: Payer: Self-pay | Admitting: Internal Medicine

## 2021-01-15 DIAGNOSIS — R7989 Other specified abnormal findings of blood chemistry: Secondary | ICD-10-CM

## 2021-01-15 DIAGNOSIS — I10 Essential (primary) hypertension: Secondary | ICD-10-CM

## 2021-01-15 DIAGNOSIS — Z0000 Encounter for general adult medical examination without abnormal findings: Secondary | ICD-10-CM

## 2021-01-15 DIAGNOSIS — R7303 Prediabetes: Secondary | ICD-10-CM

## 2021-01-15 DIAGNOSIS — Z6839 Body mass index (BMI) 39.0-39.9, adult: Secondary | ICD-10-CM

## 2021-01-30 ENCOUNTER — Encounter: Payer: Medicare Other | Admitting: Internal Medicine

## 2021-01-30 MED ORDER — AZELASTINE HCL 0.1 % NA SOLN
1.0000 | Freq: Two times a day (BID) | NASAL | 3 refills | Status: DC
Start: 2021-01-30 — End: 2021-06-26

## 2021-01-30 MED ORDER — LORATADINE 10 MG PO TABS
10.0000 mg | ORAL_TABLET | Freq: Every day | ORAL | 1 refills | Status: DC
Start: 2021-01-30 — End: 2021-02-22

## 2021-02-21 ENCOUNTER — Other Ambulatory Visit: Payer: Self-pay | Admitting: Internal Medicine

## 2021-04-15 LAB — URINALYSIS
Bilirubin, UA: NEGATIVE
Blood, UA: NEGATIVE
Glucose, UA: NEGATIVE
Ketones UA: NEGATIVE
Leukocyte Esterase, UA: NEGATIVE
Nitrite, UA: NEGATIVE
Specific Gravity UA: 1.024 (ref 1.005–1.030)
Urine pH: 6.5 (ref 5.0–7.5)
Urobilinogen, UA: 0.2 mg/dL (ref 0.2–1.0)

## 2021-04-15 LAB — LIPID PANEL
Cholesterol / HDL Ratio: 2.5 ratio (ref 0.0–4.4)
Cholesterol: 201 mg/dL — ABNORMAL HIGH (ref 100–199)
HDL: 81 mg/dL (ref >39)
LDL Chol Calculated (NIH): 109 mg/dL — ABNORMAL HIGH (ref 0–99)
Triglycerides: 62 mg/dL (ref 0–149)
VLDL Calculated: 11 mg/dL (ref 5–40)

## 2021-04-15 LAB — CBC AND DIFFERENTIAL
Baso(Absolute): 0 10*3/uL (ref 0.0–0.2)
Basophils Automated: 0 %
Eosinophils Absolute: 0.1 10*3/uL (ref 0.0–0.4)
Eosinophils Automated: 1 %
Hematocrit: 41.5 % (ref 34.0–46.6)
Hemoglobin: 13.5 g/dL (ref 11.1–15.9)
Immature Granulocytes Absolute: 0 10*3/uL (ref 0.0–0.1)
Immature Granulocytes: 0 %
Lymphocytes Absolute: 2.8 10*3/uL (ref 0.7–3.1)
Lymphocytes Automated: 41 %
MCH: 28.1 pg (ref 26.6–33.0)
MCHC: 32.5 g/dL (ref 31.5–35.7)
MCV: 86 fL (ref 79–97)
Monocytes Absolute: 0.6 10*3/uL (ref 0.1–0.9)
Monocytes: 9 %
Neutrophils Absolute Count: 3.4 10*3/uL (ref 1.4–7.0)
Neutrophils: 49 %
Platelets: 330 10*3/uL (ref 150–450)
RBC: 4.81 x10E6/uL (ref 3.77–5.28)
RDW: 12 % (ref 11.7–15.4)
WBC: 6.9 10*3/uL (ref 3.4–10.8)

## 2021-04-15 LAB — VITAMIN D,25 OH,TOTAL: Vitamin D 25-Hydroxy: 32.3 ng/mL (ref 30.0–100.0)

## 2021-04-15 LAB — TSH: TSH: 0.296 u[IU]/mL — ABNORMAL LOW (ref 0.450–4.500)

## 2021-04-15 LAB — HEMOGLOBIN A1C: Hemoglobin A1C: 6.1 % — ABNORMAL HIGH (ref 4.8–5.6)

## 2021-04-25 ENCOUNTER — Ambulatory Visit: Payer: Medicare Other | Admitting: Internal Medicine

## 2021-05-08 ENCOUNTER — Ambulatory Visit: Payer: Medicare Other | Admitting: Internal Medicine

## 2021-05-09 ENCOUNTER — Telehealth: Payer: Medicare Other | Admitting: Internal Medicine

## 2021-05-09 DIAGNOSIS — E6609 Other obesity due to excess calories: Secondary | ICD-10-CM

## 2021-05-09 DIAGNOSIS — Z6839 Body mass index (BMI) 39.0-39.9, adult: Secondary | ICD-10-CM

## 2021-05-09 DIAGNOSIS — J0100 Acute maxillary sinusitis, unspecified: Secondary | ICD-10-CM

## 2021-05-09 MED ORDER — AZITHROMYCIN 250 MG PO TABS
ORAL_TABLET | ORAL | 0 refills | Status: AC
Start: 2021-05-09 — End: 2021-05-14
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
## 2021-06-26 ENCOUNTER — Other Ambulatory Visit: Payer: Self-pay | Admitting: Internal Medicine

## 2021-06-26 ENCOUNTER — Ambulatory Visit: Payer: Medicare Other | Admitting: Internal Medicine

## 2021-06-26 ENCOUNTER — Encounter: Payer: Self-pay | Admitting: Internal Medicine

## 2021-06-26 VITALS — BP 138/98 | HR 100 | Temp 97.4°F | Ht 63.0 in | Wt 236.0 lb

## 2021-06-26 DIAGNOSIS — R7303 Prediabetes: Secondary | ICD-10-CM

## 2021-06-26 DIAGNOSIS — I10 Essential (primary) hypertension: Secondary | ICD-10-CM

## 2021-06-26 DIAGNOSIS — Z6841 Body Mass Index (BMI) 40.0 and over, adult: Secondary | ICD-10-CM

## 2021-06-26 DIAGNOSIS — N6002 Solitary cyst of left breast: Secondary | ICD-10-CM

## 2021-06-26 DIAGNOSIS — E8881 Metabolic syndrome: Secondary | ICD-10-CM

## 2021-06-26 DIAGNOSIS — R7989 Other specified abnormal findings of blood chemistry: Secondary | ICD-10-CM

## 2021-06-26 DIAGNOSIS — Z0000 Encounter for general adult medical examination without abnormal findings: Secondary | ICD-10-CM
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
## 2021-08-06 ENCOUNTER — Other Ambulatory Visit: Payer: Self-pay | Admitting: Internal Medicine

## 2021-08-06 DIAGNOSIS — N6002 Solitary cyst of left breast: Secondary | ICD-10-CM

## 2021-09-25 ENCOUNTER — Other Ambulatory Visit: Payer: Self-pay | Admitting: Internal Medicine

## 2021-10-24 ENCOUNTER — Encounter: Payer: Self-pay | Admitting: Internal Medicine

## 2021-10-24 ENCOUNTER — Ambulatory Visit: Payer: Medicare Other | Admitting: Internal Medicine

## 2021-10-24 VITALS — BP 140/88 | HR 102 | Temp 97.6°F | Ht 63.0 in | Wt 234.0 lb

## 2021-10-24 DIAGNOSIS — Z0000 Encounter for general adult medical examination without abnormal findings: Secondary | ICD-10-CM

## 2021-10-24 DIAGNOSIS — E6609 Other obesity due to excess calories: Secondary | ICD-10-CM

## 2021-10-24 DIAGNOSIS — E8881 Metabolic syndrome: Secondary | ICD-10-CM

## 2021-10-24 DIAGNOSIS — I10 Essential (primary) hypertension: Secondary | ICD-10-CM

## 2021-10-24 DIAGNOSIS — I119 Hypertensive heart disease without heart failure: Secondary | ICD-10-CM

## 2021-10-24 DIAGNOSIS — R7303 Prediabetes: Secondary | ICD-10-CM

## 2021-10-24 DIAGNOSIS — Z6839 Body mass index (BMI) 39.0-39.9, adult: Secondary | ICD-10-CM

## 2021-10-24 DIAGNOSIS — Z6841 Body Mass Index (BMI) 40.0 and over, adult: Secondary | ICD-10-CM

## 2021-10-24 DIAGNOSIS — G5711 Meralgia paresthetica, right lower limb: Secondary | ICD-10-CM

## 2021-10-24 MED ORDER — AZITHROMYCIN 250 MG PO TABS
ORAL_TABLET | ORAL | 0 refills | Status: AC
Start: 2021-10-24 — End: 2021-10-29
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
## 2021-11-24 ENCOUNTER — Telehealth: Payer: Medicare Other | Admitting: Internal Medicine

## 2021-11-24 DIAGNOSIS — Z6839 Body mass index (BMI) 39.0-39.9, adult: Secondary | ICD-10-CM

## 2021-11-24 DIAGNOSIS — E6609 Other obesity due to excess calories: Secondary | ICD-10-CM

## 2021-11-24 DIAGNOSIS — J3089 Other allergic rhinitis: Secondary | ICD-10-CM

## 2021-11-24 DIAGNOSIS — I10 Essential (primary) hypertension: Secondary | ICD-10-CM

## 2021-11-24 MED ORDER — PROMETHAZINE-DM 6.25-15 MG/5ML PO SYRP
5.0000 mL | ORAL_SOLUTION | Freq: Four times a day (QID) | ORAL | 0 refills | Status: DC | PRN
Start: 2021-11-24 — End: 2022-03-06

## 2021-11-24 MED ORDER — BENZONATATE 200 MG PO CAPS
200.0000 mg | ORAL_CAPSULE | Freq: Three times a day (TID) | ORAL | 0 refills | Status: AC | PRN
Start: 2021-11-24 — End: 2021-12-24

## 2021-11-24 MED ORDER — LORATADINE 10 MG PO TABS
10.0000 mg | ORAL_TABLET | Freq: Every day | ORAL | 0 refills | Status: DC
Start: 2021-11-24 — End: 2021-12-16

## 2021-11-26 ENCOUNTER — Encounter: Payer: Self-pay | Admitting: Internal Medicine

## 2021-11-26 ENCOUNTER — Ambulatory Visit: Payer: Medicare Other | Admitting: Internal Medicine

## 2021-11-26 VITALS — BP 172/102 | HR 100 | Temp 98.2°F | Ht 63.0 in | Wt 233.0 lb

## 2021-11-26 DIAGNOSIS — R7303 Prediabetes: Secondary | ICD-10-CM

## 2021-11-26 DIAGNOSIS — J3089 Other allergic rhinitis: Secondary | ICD-10-CM

## 2021-11-26 DIAGNOSIS — E6609 Other obesity due to excess calories: Secondary | ICD-10-CM

## 2021-11-26 DIAGNOSIS — G5711 Meralgia paresthetica, right lower limb: Secondary | ICD-10-CM

## 2021-11-26 DIAGNOSIS — Z6839 Body mass index (BMI) 39.0-39.9, adult: Secondary | ICD-10-CM

## 2021-11-26 DIAGNOSIS — J342 Deviated nasal septum: Secondary | ICD-10-CM

## 2021-11-26 DIAGNOSIS — I10 Essential (primary) hypertension: Secondary | ICD-10-CM

## 2021-11-26 MED ORDER — AZELASTINE HCL 0.1 % NA SOLN
2.0000 | Freq: Two times a day (BID) | NASAL | 5 refills | Status: DC
Start: 2021-11-26 — End: 2022-03-06

## 2021-11-26 MED ORDER — LEVOFLOXACIN 500 MG PO TABS
500.0000 mg | ORAL_TABLET | Freq: Every day | ORAL | 0 refills | Status: AC
Start: 2021-11-26 — End: 2021-12-03

## 2021-11-28 DIAGNOSIS — J342 Deviated nasal septum: Secondary | ICD-10-CM | POA: Insufficient documentation
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
## 2021-12-08 ENCOUNTER — Other Ambulatory Visit: Payer: Self-pay | Admitting: Internal Medicine

## 2021-12-08 DIAGNOSIS — R059 Cough, unspecified: Secondary | ICD-10-CM

## 2021-12-16 ENCOUNTER — Other Ambulatory Visit: Payer: Self-pay | Admitting: Internal Medicine

## 2022-01-01 LAB — URINALYSIS
Bilirubin, UA: NEGATIVE
Blood, UA: NEGATIVE
Glucose, UA: NEGATIVE
Ketones UA: NEGATIVE
Leukocyte Esterase, UA: NEGATIVE
Nitrite, UA: NEGATIVE
Protein, UR: NEGATIVE
Specific Gravity UA: 1.018 (ref 1.005–1.030)
Urine pH: 7 (ref 5.0–7.5)
Urobilinogen, UA: 0.2 mg/dL (ref 0.2–1.0)

## 2022-01-01 LAB — CBC AND DIFFERENTIAL
Baso(Absolute): 0 10*3/uL (ref 0.0–0.2)
Basophils Automated: 0 %
Eosinophils Absolute: 0.1 10*3/uL (ref 0.0–0.4)
Eosinophils Automated: 1 %
Hematocrit: 41 % (ref 34.0–46.6)
Hemoglobin: 13.3 g/dL (ref 11.1–15.9)
Immature Granulocytes Absolute: 0 10*3/uL (ref 0.0–0.1)
Immature Granulocytes: 0 %
Lymphocytes Absolute: 3.4 10*3/uL — ABNORMAL HIGH (ref 0.7–3.1)
Lymphocytes Automated: 37 %
MCH: 28.1 pg (ref 26.6–33.0)
MCHC: 32.4 g/dL (ref 31.5–35.7)
MCV: 87 fL (ref 79–97)
Monocytes Absolute: 0.8 10*3/uL (ref 0.1–0.9)
Monocytes: 9 %
Neutrophils Absolute Count: 4.9 10*3/uL (ref 1.4–7.0)
Neutrophils: 53 %
Platelets: 352 10*3/uL (ref 150–450)
RBC: 4.74 x10E6/uL (ref 3.77–5.28)
RDW: 11.8 % (ref 11.7–15.4)
WBC: 9.3 10*3/uL (ref 3.4–10.8)

## 2022-01-01 LAB — COMPREHENSIVE METABOLIC PANEL
ALT: 19 IU/L (ref 0–32)
AST (SGOT): 22 IU/L (ref 0–40)
Albumin/Globulin Ratio: 1.3 (ref 1.2–2.2)
Albumin: 4.3 g/dL (ref 3.8–4.8)
Alkaline Phosphatase: 84 IU/L (ref 44–121)
BUN / Creatinine Ratio: 15 (ref 12–28)
BUN: 13 mg/dL (ref 8–27)
Bilirubin, Total: 0.5 mg/dL (ref 0.0–1.2)
CO2: 27 mmol/L (ref 20–29)
Calcium: 10.5 mg/dL — ABNORMAL HIGH (ref 8.7–10.3)
Chloride: 100 mmol/L (ref 96–106)
Creatinine: 0.84 mg/dL (ref 0.57–1.00)
Globulin, Total: 3.2 g/dL (ref 1.5–4.5)
Glucose: 83 mg/dL (ref 70–99)
Potassium: 3.5 mmol/L (ref 3.5–5.2)
Protein, Total: 7.5 g/dL (ref 6.0–8.5)
Sodium: 141 mmol/L (ref 134–144)
eGFR: 72 mL/min/{1.73_m2} (ref >59)

## 2022-01-01 LAB — LIPID PANEL
Cholesterol / HDL Ratio: 2.7 ratio (ref 0.0–4.4)
Cholesterol: 197 mg/dL (ref 100–199)
HDL: 72 mg/dL (ref >39)
LDL Chol Calculated (NIH): 108 mg/dL — ABNORMAL HIGH (ref 0–99)
Triglycerides: 93 mg/dL (ref 0–149)
VLDL Calculated: 17 mg/dL (ref 5–40)

## 2022-01-01 LAB — TSH: TSH: 0.571 u[IU]/mL (ref 0.450–4.500)

## 2022-01-01 LAB — T4, FREE: T4, Free: 1.13 ng/dL (ref 0.82–1.77)

## 2022-01-01 LAB — HEMOGLOBIN A1C: Hemoglobin A1C: 6.2 % — ABNORMAL HIGH (ref 4.8–5.6)

## 2022-01-01 LAB — T4: T4, Total: 7.7 ug/dL (ref 4.5–12.0)

## 2022-01-23 ENCOUNTER — Ambulatory Visit: Payer: Medicare Other | Admitting: Internal Medicine

## 2022-02-03 ENCOUNTER — Telehealth: Payer: Medicare Other | Admitting: Internal Medicine

## 2022-02-03 DIAGNOSIS — Z6837 Body mass index (BMI) 37.0-37.9, adult: Secondary | ICD-10-CM

## 2022-02-03 DIAGNOSIS — Z6841 Body Mass Index (BMI) 40.0 and over, adult: Secondary | ICD-10-CM

## 2022-02-03 DIAGNOSIS — E6609 Other obesity due to excess calories: Secondary | ICD-10-CM

## 2022-02-03 DIAGNOSIS — I10 Essential (primary) hypertension: Secondary | ICD-10-CM

## 2022-02-03 DIAGNOSIS — R600 Localized edema: Secondary | ICD-10-CM
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
## 2022-03-06 ENCOUNTER — Telehealth: Payer: Medicare Other | Admitting: Internal Medicine

## 2022-03-06 DIAGNOSIS — I10 Essential (primary) hypertension: Secondary | ICD-10-CM

## 2022-03-06 DIAGNOSIS — J3089 Other allergic rhinitis: Secondary | ICD-10-CM

## 2022-03-06 DIAGNOSIS — E6609 Other obesity due to excess calories: Secondary | ICD-10-CM

## 2022-03-06 DIAGNOSIS — Z6837 Body mass index (BMI) 37.0-37.9, adult: Secondary | ICD-10-CM

## 2022-03-06 MED ORDER — PROMETHAZINE-DM 6.25-15 MG/5ML PO SYRP
5.0000 mL | ORAL_SOLUTION | Freq: Four times a day (QID) | ORAL | 0 refills | Status: DC | PRN
Start: 2022-03-06 — End: 2023-01-07

## 2022-03-06 MED ORDER — AZITHROMYCIN 250 MG PO TABS
ORAL_TABLET | ORAL | 0 refills | Status: DC
Start: 2022-03-06 — End: 2022-08-18

## 2022-03-06 MED ORDER — AZELASTINE HCL 0.1 % NA SOLN
2.0000 | Freq: Two times a day (BID) | NASAL | 5 refills | Status: DC
Start: 2022-03-06 — End: 2023-01-07

## 2022-03-06 MED ORDER — LORATADINE 10 MG PO TABS
10.0000 mg | ORAL_TABLET | Freq: Every day | ORAL | 1 refills | Status: DC
Start: 2022-03-06 — End: 2023-01-07
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
## 2022-03-07 ENCOUNTER — Encounter: Payer: Self-pay | Admitting: Internal Medicine

## 2022-04-01 ENCOUNTER — Telehealth: Payer: Self-pay | Admitting: Internal Medicine

## 2022-04-01 ENCOUNTER — Other Ambulatory Visit: Payer: Self-pay | Admitting: Internal Medicine

## 2022-04-01 DIAGNOSIS — Z1211 Encounter for screening for malignant neoplasm of colon: Secondary | ICD-10-CM

## 2022-05-06 ENCOUNTER — Telehealth: Payer: Medicare Other | Admitting: Internal Medicine

## 2022-05-06 ENCOUNTER — Encounter: Payer: Self-pay | Admitting: Internal Medicine

## 2022-05-06 DIAGNOSIS — I10 Essential (primary) hypertension: Secondary | ICD-10-CM

## 2022-05-06 DIAGNOSIS — M47812 Spondylosis without myelopathy or radiculopathy, cervical region: Secondary | ICD-10-CM

## 2022-05-06 DIAGNOSIS — E6609 Other obesity due to excess calories: Secondary | ICD-10-CM

## 2022-05-06 DIAGNOSIS — Z6839 Body mass index (BMI) 39.0-39.9, adult: Secondary | ICD-10-CM

## 2022-05-06 DIAGNOSIS — G5692 Unspecified mononeuropathy of left upper limb: Secondary | ICD-10-CM
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
## 2022-07-06 ENCOUNTER — Ambulatory Visit: Payer: Medicare Other | Admitting: Internal Medicine

## 2022-07-06 ENCOUNTER — Encounter: Payer: Self-pay | Admitting: Internal Medicine

## 2022-07-06 VITALS — BP 140/84 | HR 100 | Temp 97.5°F | Ht 63.0 in | Wt 229.0 lb

## 2022-07-06 DIAGNOSIS — I10 Essential (primary) hypertension: Secondary | ICD-10-CM

## 2022-07-06 DIAGNOSIS — R600 Localized edema: Secondary | ICD-10-CM

## 2022-07-06 DIAGNOSIS — E8881 Metabolic syndrome: Secondary | ICD-10-CM

## 2022-07-06 DIAGNOSIS — R7303 Prediabetes: Secondary | ICD-10-CM

## 2022-07-06 DIAGNOSIS — E039 Hypothyroidism, unspecified: Secondary | ICD-10-CM
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
## 2022-07-11 LAB — BASIC METABOLIC PANEL
BUN / Creatinine Ratio: 14 (ref 12–28)
BUN: 12 mg/dL (ref 8–27)
CO2: 22 mmol/L (ref 20–29)
Calcium: 10 mg/dL (ref 8.7–10.3)
Chloride: 104 mmol/L (ref 96–106)
Creatinine: 0.86 mg/dL (ref 0.57–1.00)
Glucose: 88 mg/dL (ref 70–99)
Potassium: 4 mmol/L (ref 3.5–5.2)
Sodium: 142 mmol/L (ref 134–144)
eGFR: 70 mL/min/{1.73_m2} (ref >59)

## 2022-07-11 LAB — CBC AND DIFFERENTIAL
Baso(Absolute): 0 10*3/uL (ref 0.0–0.2)
Basophils Automated: 0 %
Eosinophils Absolute: 0.1 10*3/uL (ref 0.0–0.4)
Eosinophils Automated: 1 %
Hematocrit: 40.4 % (ref 34.0–46.6)
Hemoglobin: 12.9 g/dL (ref 11.1–15.9)
Immature Granulocytes Absolute: 0 10*3/uL (ref 0.0–0.1)
Immature Granulocytes: 0 %
Lymphocytes Absolute: 3 10*3/uL (ref 0.7–3.1)
Lymphocytes Automated: 36 %
MCH: 27.9 pg (ref 26.6–33.0)
MCHC: 31.9 g/dL (ref 31.5–35.7)
MCV: 87 fL (ref 79–97)
Monocytes Absolute: 0.7 10*3/uL (ref 0.1–0.9)
Monocytes: 8 %
Neutrophils Absolute Count: 4.5 10*3/uL (ref 1.4–7.0)
Neutrophils: 55 %
Platelets: 320 10*3/uL (ref 150–450)
RBC: 4.63 x10E6/uL (ref 3.77–5.28)
RDW: 12.2 % (ref 11.7–15.4)
WBC: 8.3 10*3/uL (ref 3.4–10.8)

## 2022-07-11 LAB — URINALYSIS WITH REFLEX TO MICROSCOPIC EXAM IF INDICATED
Bilirubin, UA: NEGATIVE
Blood, UA: NEGATIVE
Glucose, UA: NEGATIVE
Ketones UA: NEGATIVE
Leukocyte Esterase, UA: NEGATIVE
Nitrite, UA: NEGATIVE
Protein, UR: NEGATIVE
Specific Gravity UA: 1.02 (ref 1.005–1.030)
Urine pH: 6 (ref 5.0–7.5)
Urobilinogen, UA: 0.2 mg/dL (ref 0.2–1.0)

## 2022-07-11 LAB — HEMOGLOBIN A1C: Hemoglobin A1C: 6.1 % — ABNORMAL HIGH (ref 4.8–5.6)

## 2022-07-11 LAB — TSH: TSH: 0.406 u[IU]/mL — ABNORMAL LOW (ref 0.450–4.500)

## 2022-08-05 ENCOUNTER — Other Ambulatory Visit: Payer: Self-pay | Admitting: Internal Medicine

## 2022-08-05 DIAGNOSIS — Z1231 Encounter for screening mammogram for malignant neoplasm of breast: Secondary | ICD-10-CM

## 2022-08-13 ENCOUNTER — Encounter: Payer: Self-pay | Admitting: Internal Medicine

## 2022-08-18 ENCOUNTER — Other Ambulatory Visit: Payer: Self-pay | Admitting: Internal Medicine

## 2022-08-18 MED ORDER — AZITHROMYCIN 250 MG PO TABS
ORAL_TABLET | ORAL | 0 refills | Status: AC
Start: 2022-08-18 — End: 2022-08-23

## 2022-08-31 ENCOUNTER — Telehealth: Payer: Medicare Other | Admitting: Internal Medicine
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
## 2022-09-01 ENCOUNTER — Telehealth: Payer: Medicare Other | Admitting: Internal Medicine

## 2022-09-04 ENCOUNTER — Telehealth: Payer: Medicare Other | Admitting: Internal Medicine
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
## 2022-09-17 ENCOUNTER — Ambulatory Visit: Payer: Medicare Other | Admitting: Internal Medicine

## 2022-09-21 ENCOUNTER — Other Ambulatory Visit: Payer: Self-pay | Admitting: Internal Medicine

## 2022-09-21 MED ORDER — EFINACONAZOLE 10 % EX SOLN
CUTANEOUS | 1 refills | Status: DC
Start: 2022-09-21 — End: 2022-09-24

## 2022-09-24 MED ORDER — CICLOPIROX 8 % EX SOLN
Freq: Every evening | CUTANEOUS | 3 refills | Status: DC
Start: 2022-09-24 — End: 2023-10-25

## 2022-10-01 ENCOUNTER — Other Ambulatory Visit: Payer: Self-pay | Admitting: Internal Medicine

## 2022-10-01 MED ORDER — EFINACONAZOLE 10 % EX SOLN
CUTANEOUS | 1 refills | Status: DC
Start: 2022-10-01 — End: 2023-10-25

## 2022-10-21 ENCOUNTER — Other Ambulatory Visit: Payer: Self-pay | Admitting: Internal Medicine

## 2022-10-21 MED ORDER — METHOCARBAMOL 500 MG PO TABS
500.0000 mg | ORAL_TABLET | Freq: Three times a day (TID) | ORAL | 1 refills | Status: DC | PRN
Start: 2022-10-21 — End: 2023-01-07

## 2022-11-04 ENCOUNTER — Other Ambulatory Visit: Payer: Self-pay | Admitting: Internal Medicine

## 2022-11-04 MED ORDER — DICLOFENAC SODIUM 1 % EX GEL
4.0000 g | Freq: Four times a day (QID) | CUTANEOUS | 3 refills | Status: DC
Start: 2022-11-04 — End: 2023-08-04

## 2022-12-09 ENCOUNTER — Other Ambulatory Visit: Payer: Self-pay | Admitting: Internal Medicine

## 2022-12-10 ENCOUNTER — Encounter: Payer: Medicare Other | Admitting: Internal Medicine

## 2022-12-10 MED ORDER — HYDROCHLOROTHIAZIDE 25 MG PO TABS
25.0000 mg | ORAL_TABLET | Freq: Every day | ORAL | 3 refills | Status: DC
Start: 2022-12-10 — End: 2023-04-19

## 2022-12-10 MED ORDER — AZITHROMYCIN 250 MG PO TABS
ORAL_TABLET | ORAL | 0 refills | Status: AC
Start: 2022-12-10 — End: 2022-12-15

## 2023-01-07 ENCOUNTER — Encounter: Payer: Self-pay | Admitting: Internal Medicine

## 2023-01-07 ENCOUNTER — Ambulatory Visit: Payer: Medicare Other | Admitting: Internal Medicine

## 2023-01-07 VITALS — BP 140/88 | HR 91 | Temp 97.7°F | Ht 63.0 in | Wt 225.0 lb

## 2023-01-07 DIAGNOSIS — M810 Age-related osteoporosis without current pathological fracture: Secondary | ICD-10-CM

## 2023-01-07 DIAGNOSIS — R928 Other abnormal and inconclusive findings on diagnostic imaging of breast: Secondary | ICD-10-CM

## 2023-01-07 DIAGNOSIS — N6009 Solitary cyst of unspecified breast: Secondary | ICD-10-CM

## 2023-01-07 DIAGNOSIS — Z1231 Encounter for screening mammogram for malignant neoplasm of breast: Secondary | ICD-10-CM
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
## 2023-01-22 ENCOUNTER — Encounter: Payer: Self-pay | Admitting: Internal Medicine

## 2023-01-22 ENCOUNTER — Telehealth: Payer: Medicare Other | Admitting: Internal Medicine

## 2023-01-22 VITALS — Ht 63.0 in | Wt 225.0 lb

## 2023-01-22 DIAGNOSIS — J069 Acute upper respiratory infection, unspecified: Secondary | ICD-10-CM

## 2023-01-22 DIAGNOSIS — I10 Essential (primary) hypertension: Secondary | ICD-10-CM

## 2023-01-22 MED ORDER — AZITHROMYCIN 250 MG PO TABS
ORAL_TABLET | ORAL | 0 refills | Status: AC
Start: 2023-01-22 — End: 2023-02-03

## 2023-01-22 MED ORDER — PROMETHAZINE-DM 6.25-15 MG/5ML PO SYRP
5.0000 mL | ORAL_SOLUTION | Freq: Four times a day (QID) | ORAL | 0 refills | Status: DC | PRN
Start: 2023-01-22 — End: 2023-05-05
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
## 2023-01-26 ENCOUNTER — Ambulatory Visit: Payer: Medicare Other | Admitting: Internal Medicine

## 2023-01-26 ENCOUNTER — Encounter: Payer: Self-pay | Admitting: Internal Medicine

## 2023-01-26 VITALS — BP 138/88 | HR 90 | Temp 97.8°F | Ht 63.0 in | Wt 220.0 lb

## 2023-01-26 DIAGNOSIS — J3089 Other allergic rhinitis: Secondary | ICD-10-CM

## 2023-01-26 DIAGNOSIS — Z6841 Body Mass Index (BMI) 40.0 and over, adult: Secondary | ICD-10-CM

## 2023-01-26 DIAGNOSIS — M659 Unspecified synovitis and tenosynovitis, unspecified site: Secondary | ICD-10-CM

## 2023-01-26 DIAGNOSIS — I10 Essential (primary) hypertension: Secondary | ICD-10-CM

## 2023-01-26 MED ORDER — LORATADINE 10 MG PO TABS
10.0000 mg | ORAL_TABLET | Freq: Every day | ORAL | 2 refills | Status: DC
Start: 2023-01-26 — End: 2023-02-09
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
## 2023-01-27 ENCOUNTER — Other Ambulatory Visit: Payer: Self-pay | Admitting: Internal Medicine

## 2023-01-27 MED ORDER — AZITHROMYCIN 250 MG PO TABS
ORAL_TABLET | ORAL | 0 refills | Status: AC
Start: 2023-01-27 — End: 2023-02-02

## 2023-02-09 ENCOUNTER — Other Ambulatory Visit: Payer: Self-pay | Admitting: Internal Medicine

## 2023-02-09 DIAGNOSIS — E8881 Metabolic syndrome: Secondary | ICD-10-CM

## 2023-02-09 DIAGNOSIS — I10 Essential (primary) hypertension: Secondary | ICD-10-CM

## 2023-02-09 DIAGNOSIS — R7303 Prediabetes: Secondary | ICD-10-CM

## 2023-02-09 DIAGNOSIS — E66812 Obesity, class 2: Secondary | ICD-10-CM

## 2023-02-09 MED ORDER — LEVOCETIRIZINE DIHYDROCHLORIDE 5 MG PO TABS
5.0000 mg | ORAL_TABLET | Freq: Every evening | ORAL | 3 refills | Status: DC
Start: 2023-02-09 — End: 2023-05-05

## 2023-03-02 LAB — TSH: TSH: 0.557 u[IU]/mL (ref 0.450–4.500)

## 2023-03-02 LAB — CBC AND DIFFERENTIAL
Baso(Absolute): 0 10*3/uL (ref 0.0–0.2)
Basophils Automated: 0 %
Eosinophils Absolute: 0.1 10*3/uL (ref 0.0–0.4)
Eosinophils Automated: 1 %
Hematocrit: 40.6 % (ref 34.0–46.6)
Hemoglobin: 12.9 g/dL (ref 11.1–15.9)
Immature Granulocytes Absolute: 0 10*3/uL (ref 0.0–0.1)
Immature Granulocytes: 0 %
Lymphocytes Absolute: 3.8 10*3/uL — ABNORMAL HIGH (ref 0.7–3.1)
Lymphocytes Automated: 38 %
MCH: 28.1 pg (ref 26.6–33.0)
MCHC: 31.8 g/dL (ref 31.5–35.7)
MCV: 89 fL (ref 79–97)
Monocytes Absolute: 0.9 10*3/uL (ref 0.1–0.9)
Monocytes: 9 %
Neutrophils Absolute Count: 5.1 10*3/uL (ref 1.4–7.0)
Neutrophils: 52 %
Platelets: 327 10*3/uL (ref 150–450)
RBC: 4.59 x10E6/uL (ref 3.77–5.28)
RDW: 12.1 % (ref 11.7–15.4)
WBC: 10 10*3/uL (ref 3.4–10.8)

## 2023-03-02 LAB — LIPID PANEL
Cholesterol / HDL Ratio: 2.2 ratio (ref 0.0–4.4)
Cholesterol: 193 mg/dL (ref 100–199)
HDL: 86 mg/dL (ref >39)
LDL Chol Calculated (NIH): 96 mg/dL (ref 0–99)
Triglycerides: 60 mg/dL (ref 0–149)
VLDL Calculated: 11 mg/dL (ref 5–40)

## 2023-03-02 LAB — HEPATIC FUNCTION PANEL (LFT)
ALT: 17 IU/L (ref 0–32)
AST (SGOT): 24 IU/L (ref 0–40)
Albumin: 4.1 g/dL (ref 3.8–4.8)
Alkaline Phosphatase: 92 IU/L (ref 44–121)
Bilirubin Direct: 0.14 mg/dL (ref 0.00–0.40)
Bilirubin, Total: 0.3 mg/dL (ref 0.0–1.2)
Protein, Total: 7.3 g/dL (ref 6.0–8.5)

## 2023-03-02 LAB — URINALYSIS WITH REFLEX TO MICROSCOPIC EXAM IF INDICATED
Bilirubin, UA: NEGATIVE
Blood, UA: NEGATIVE
Glucose, UA: NEGATIVE
Ketones UA: NEGATIVE
Leukocyte Esterase, UA: NEGATIVE
Nitrite, UA: NEGATIVE
Specific Gravity UA: 1.01 (ref 1.005–1.030)
Urine pH: 6.5 (ref 5.0–7.5)
Urobilinogen, UA: 0.2 mg/dL (ref 0.2–1.0)

## 2023-03-02 LAB — BASIC METABOLIC PANEL
BUN / Creatinine Ratio: 14 (ref 12–28)
BUN: 11 mg/dL (ref 8–27)
CO2: 24 mmol/L (ref 20–29)
Calcium: 10.1 mg/dL (ref 8.7–10.3)
Chloride: 102 mmol/L (ref 96–106)
Creatinine: 0.77 mg/dL (ref 0.57–1.00)
Glucose: 80 mg/dL (ref 70–99)
Potassium: 4.1 mmol/L (ref 3.5–5.2)
Sodium: 141 mmol/L (ref 134–144)
eGFR: 80 mL/min/{1.73_m2} (ref >59)

## 2023-03-02 LAB — HEMOGLOBIN A1C: Hemoglobin A1C: 6 % — ABNORMAL HIGH (ref 4.8–5.6)

## 2023-04-09 ENCOUNTER — Ambulatory Visit: Payer: Medicare Other | Admitting: Internal Medicine

## 2023-04-19 ENCOUNTER — Ambulatory Visit: Admitting: Internal Medicine

## 2023-04-19 ENCOUNTER — Encounter: Payer: Self-pay | Admitting: Internal Medicine

## 2023-04-19 VITALS — BP 140/90 | HR 79 | Temp 98.0°F | Ht 63.0 in | Wt 227.0 lb

## 2023-04-19 DIAGNOSIS — Z6839 Body mass index (BMI) 39.0-39.9, adult: Secondary | ICD-10-CM

## 2023-04-19 DIAGNOSIS — G5602 Carpal tunnel syndrome, left upper limb: Secondary | ICD-10-CM

## 2023-04-19 DIAGNOSIS — M2012 Hallux valgus (acquired), left foot: Secondary | ICD-10-CM

## 2023-04-19 DIAGNOSIS — M1712 Unilateral primary osteoarthritis, left knee: Secondary | ICD-10-CM

## 2023-04-19 DIAGNOSIS — E66812 Obesity, class 2: Secondary | ICD-10-CM

## 2023-04-19 DIAGNOSIS — I10 Essential (primary) hypertension: Secondary | ICD-10-CM

## 2023-04-19 DIAGNOSIS — M2011 Hallux valgus (acquired), right foot: Secondary | ICD-10-CM

## 2023-04-19 MED ORDER — CANDESARTAN CILEXETIL 8 MG PO TABS
8.0000 mg | ORAL_TABLET | Freq: Every day | ORAL | 3 refills | Status: AC
Start: 2023-04-19 — End: ?

## 2023-04-19 MED ORDER — HYDROCHLOROTHIAZIDE 25 MG PO TABS
25.0000 mg | ORAL_TABLET | Freq: Every day | ORAL | 3 refills | Status: AC
Start: 2023-04-19 — End: ?
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
## 2023-04-23 ENCOUNTER — Ambulatory Visit: Admitting: Internal Medicine

## 2023-04-26 ENCOUNTER — Encounter: Payer: Self-pay | Admitting: Internal Medicine

## 2023-04-26 ENCOUNTER — Telehealth: Admitting: Internal Medicine

## 2023-04-26 ENCOUNTER — Other Ambulatory Visit: Payer: Self-pay
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
## 2023-04-27 ENCOUNTER — Telehealth: Admitting: Internal Medicine

## 2023-04-29 ENCOUNTER — Telehealth: Payer: PRIVATE HEALTH INSURANCE | Admitting: Internal Medicine

## 2023-05-01 NOTE — ED Provider Notes (Signed)
 Patient Evaluated: 05/01/2023 - 10:17 AM - Jamie Sharp    Means of arrival:Car        CC / HPI                                                                                                                                                                           Chief Complaint from Triage Note:   Patient presents with:  Injury, Hip: Patient fell in a hole at work Thursday, [facility] They called her this morning and told her to come to the ER and have another x-ray. She injured her left hip. She has a hx of HTN and stated she did not take her medication this medication.          HPI:  Jamie Sharp is a 78 y.o. female PMH HTN who presents with left hip discomfort. 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 No neck pain, no LOC, no headache, no paresthesias or weakness. She did note bilateral knee and left hip pain and reports she had [facility] at [facility] which were initially read negative and then she was called about concern for a possible hip fracture and directed to the ED    She notes she takes her BP medication at night and for this reason her blood pressure is elevated this am, as the hydrochlorothiazide  makes her urinate and she didn't take it this morning    Recently seen by her pmd (dr verle) and started on cadesartan as well on 04/19/23    I also reviewed the following external record(s): Outpatient notes.  According to these as above     Relevant Past Medical, Social and Family History - See resident note (Sharp applicable) for additional historical information   Past Medical / Surgical History: No past medical history on file. No past surgical history on file.  Family History: family history is not on file.  Social Hx:       ALLERGIES: Amoxicillin, Codeine, Latex, Pcn [Penicillins]      Review of Systems - See HPI and resident note (Sharp applicable)  for additional ROS Information   No  LMP recorded.  Review of Systems   Constitutional:  Negative for chills and fever.   HENT:  Negative for rhinorrhea and sore throat.    Respiratory:  Negative for chest tightness and shortness of breath.    Cardiovascular:  Negative for chest pain and palpitations.   Gastrointestinal:  Negative for nausea and vomiting.   Musculoskeletal:  Negative for back pain and neck pain.   Skin:  Negative for  rash and wound.   Neurological:  Negative for dizziness and headaches.   Psychiatric/Behavioral:  Negative for agitation and confusion.          Physical Exam     LAST  VS BP: (!) 179/106 Pulse: 98 (05/01/23 0755)  Heart Rate (Monitored): (not recorded) Resp: 17  SpO2: 96 % Temp: 36.6 C (97.8 F)   FIRST  VS BP: (!) 179/106 Pulse: 98 (05/01/23 0755)  Heart Rate (Monitored): (not recorded) Resp: 17  SpO2: 96 % Temp: 36.6 C (97.8 F)   **These vitals reflect the first/last taken during this encounter. Within each group, they are not necessarily taken at the same time.    Physical Exam  Constitutional:       Appearance: Normal appearance.   HENT:      Head: Normocephalic and atraumatic.      Nose: Nose normal.      Mouth/Throat:      Mouth: Mucous membranes are moist.   Eyes:      Extraocular Movements: Extraocular movements intact.      Pupils: Pupils are equal, round, and reactive to light.   Neck:      Comments: No midline cervical, thoracic, lumbar spine tenderness to palpation     Cardiovascular:      Rate and Rhythm: Normal rate and regular rhythm.      Heart sounds: Normal heart sounds.   Pulmonary:      Effort: Pulmonary effort is normal.      Breath sounds: Normal breath sounds.   Abdominal:      General: Bowel sounds are normal. There is no distension.      Palpations: Abdomen is soft.      Tenderness: There is no abdominal tenderness.   Musculoskeletal:         General: No tenderness.      Cervical back: Neck supple. No tenderness.      Right lower leg: No edema.      Left lower leg: No edema.      Comments: Mild  discomfort over right trapezius musculature otherwise No tenderness over bilateral upper extremities including bony clavicles to palpation, no tenderness over bilateral lower extremities including bony pelvis to palpation      Skin:     Findings: No lesion or rash.   Neurological:      General: No focal deficit present.      Mental Status: She is alert and oriented to person, place, and time.      Motor: No weakness.      Gait: Gait normal.   Psychiatric:         Mood and Affect: Mood normal.         Behavior: Behavior normal.              Diagnostic Studies   ECG Interpretation  No results found for this visit on 05/01/23.       XR Hip & Pelvis 2/3 Views Left  Result Date: 05/01/2023  INDICATION:  Pain from trauma/injury. TECHNIQUE: AP view of the pelvis and two radiographic views of the left hip were obtained. COMPARISON: None. FINDINGS: No evidence of fracture or dislocation. No evidence of periosteal reaction or osseous erosive changes. Joint spaces are preserved. Visualized soft tissues are unremarkable, without evidence of radiopaque foreign body.     IMPRESSION: No acute osseous pathology.              Progress Notes, Medical Decision Making and Critical Care  78 y.o. female PMH HTN who presents with left hip discomfort. 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 No neck pain, no LOC, no headache, no paresthesias or weakness. She did note bilateral knee and left hip pain and reports she [facility] at [facility] which were initially read negative and then she was called about concern for a possible hip fracture and directed to the ED    Differential dx includes but not limited to hip contusion, unlikely hip fracture as normal gait and no focal tenderness to palpation    Plan xr l hip    Discharge home with analgesia and pmd follow up    Discussed exam findings, imaging results, as well as treatment plan with  patient. Patient understands and agrees. Opportunity for questions was given and all questions were answered. Additional verbal discharge instructions provided. Follow up with PMD . Patient will be discharged at this time and was urged to return to ED for any exacerbation of symptoms-discussed to follow up blood pressure with her pmd and she will take her home bp meds as soon as she gets home        Clinical Impression and Disposition    Diagnosis:  1. Left hip pain    2. Fall from standing, initial encounter      ED Disposition: Discharge - 05/01/2023 10:12  Condition at time of disposition: Improved                    Floria FORBES Casey, MD  05/01/23 1022

## 2023-05-02 IMAGING — MR MRI LSPINE WO CONTRAST
4 of 6 series · 25 of 48 positions shown · non-contrast
Comparison: Radiograph lumbar spine 12/04/2020.

HISTORY: 75-year-old female with stenosis, lumbar.
TECHNIQUE: Multiplanar, multisequential MR images of the lumbar spine were obtained without intravenous contrast.

[Series 1: bSSFP · axial · 8.0mm · 1.37mm/px · z∈[-61,+175]mm · 8 of 25 slices shown]
[im 1/25]
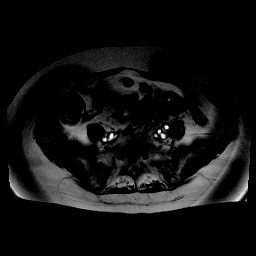
[im 4/25]
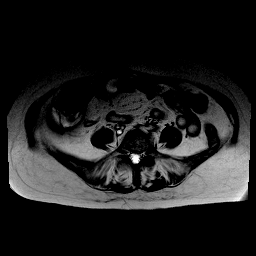
[im 7/25]
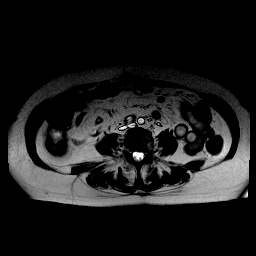
[im 11/25]
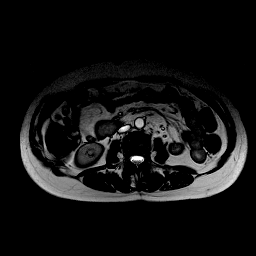
[im 14/25]
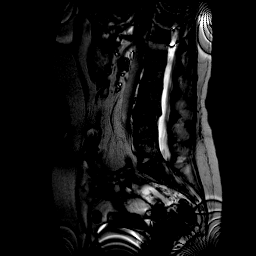
[im 18/25]
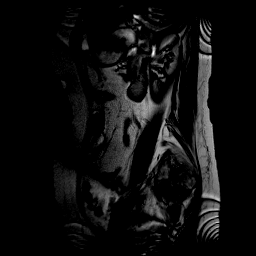
[im 21/25]
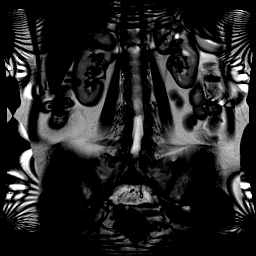
[im 25/25]
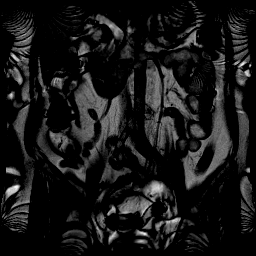

[Series 2: t2_sag · sagittal · 4.0mm · 0.38mm/px · 6 of 15 slices shown]
[im 1/15]
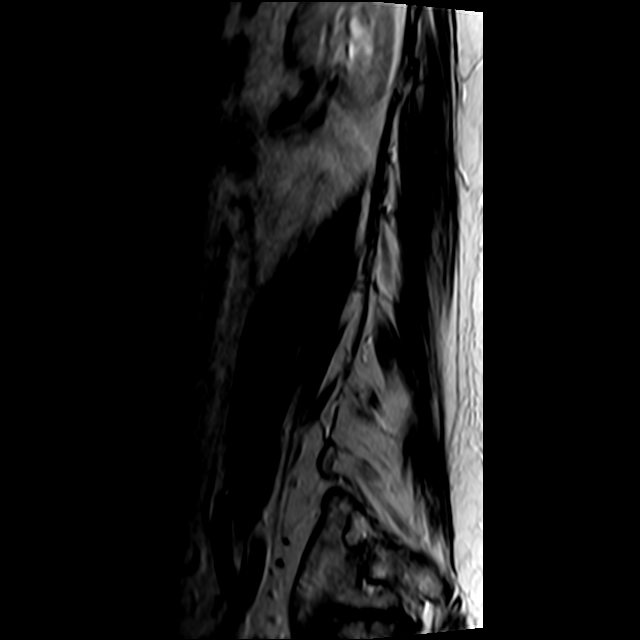
[im 3/15]
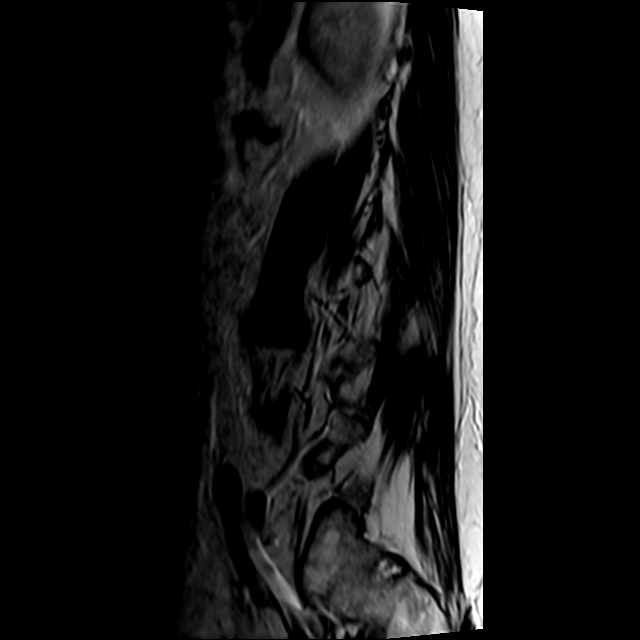
[im 6/15]
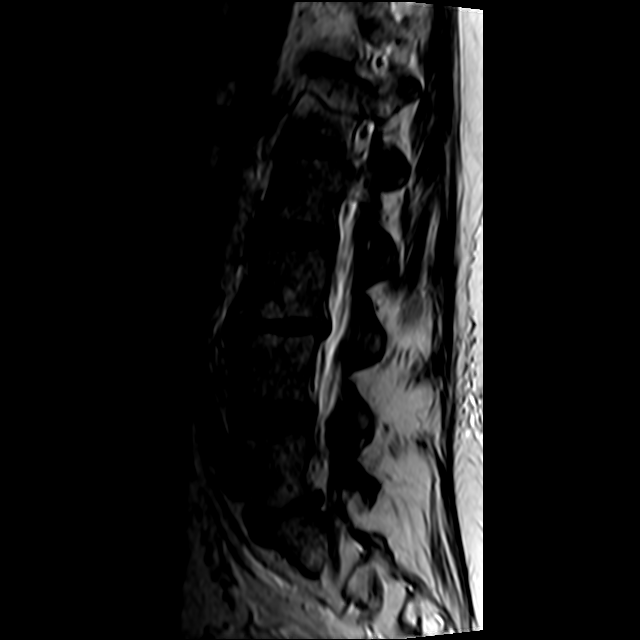
[im 9/15]
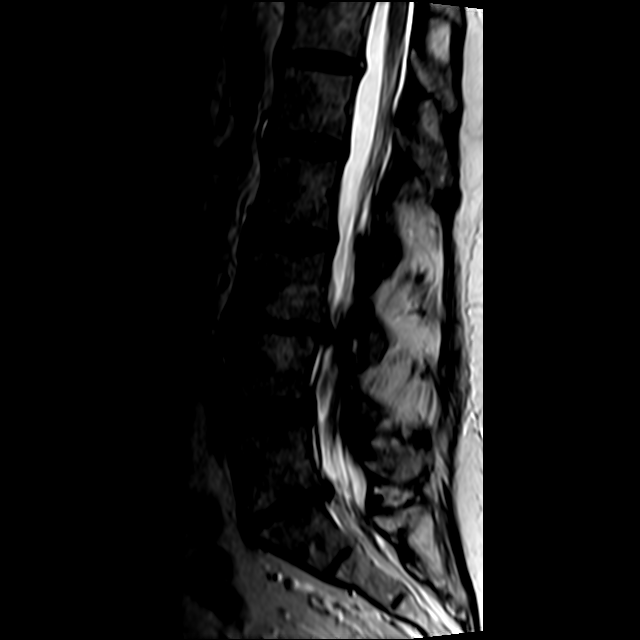
[im 12/15]
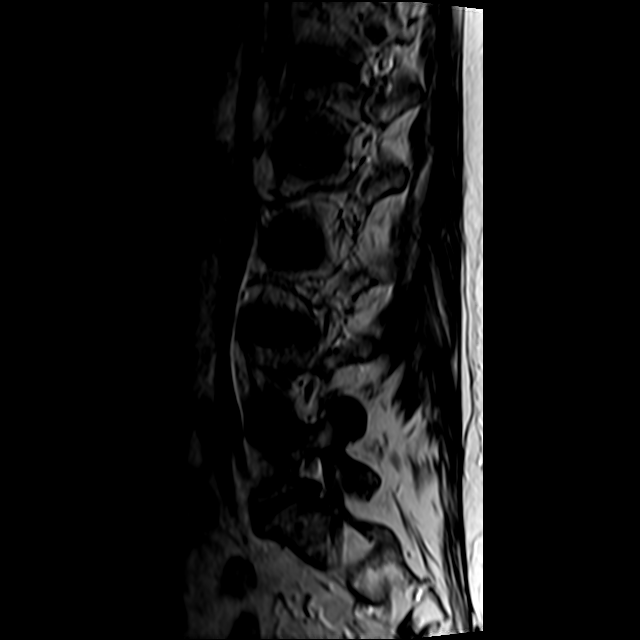
[im 15/15]
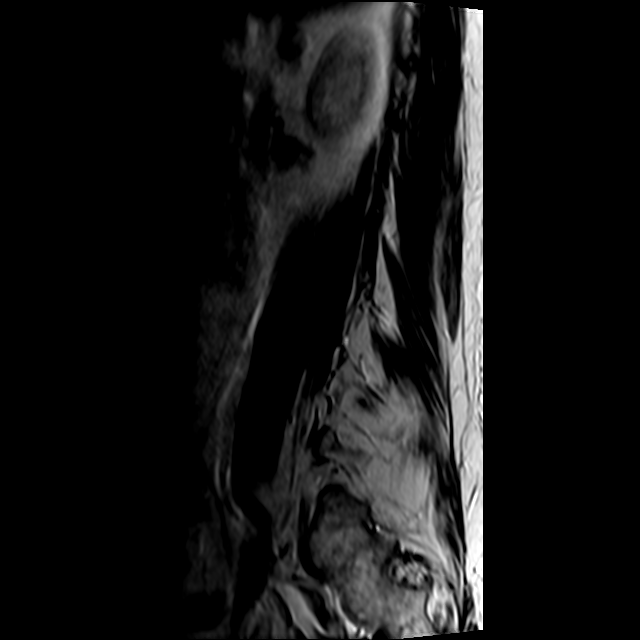

[Series 3: t1_sag · sagittal · 4.0mm · 0.75mm/px · 6 of 15 slices shown]
[im 1/15]
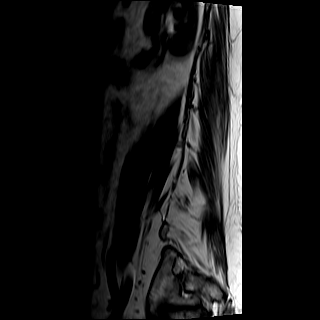
[im 3/15]
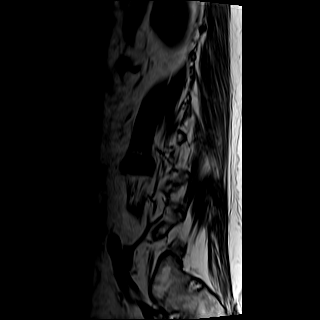
[im 6/15]
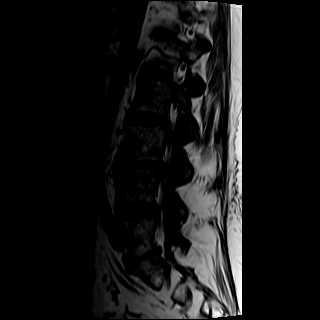
[im 9/15]
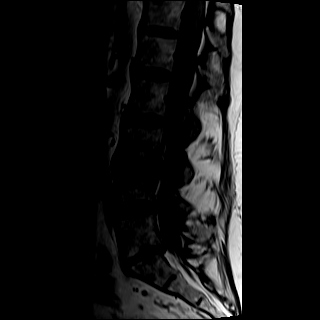
[im 12/15]
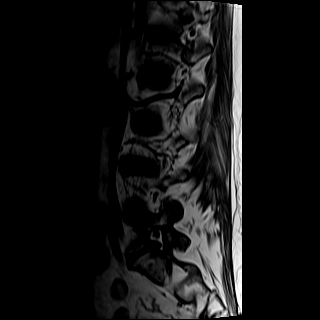
[im 15/15]
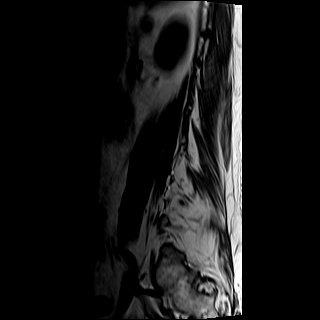

[Series 4: ir_sag · sagittal · 4.0mm · 0.47mm/px · 5 of 15 slices shown]
[im 1/15]
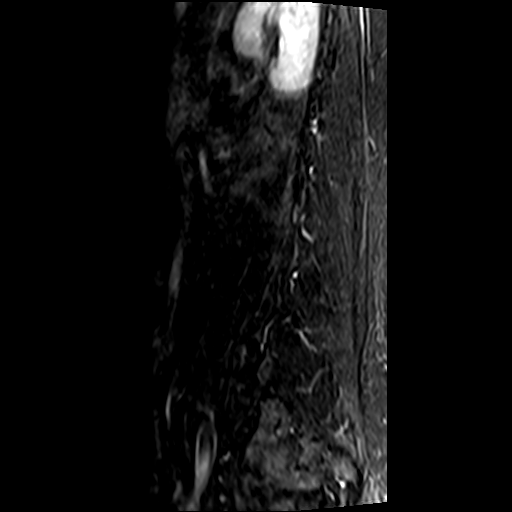
[im 3/15]
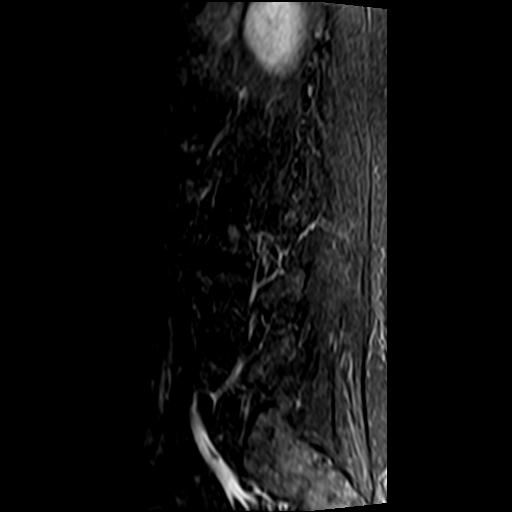
[im 6/15]
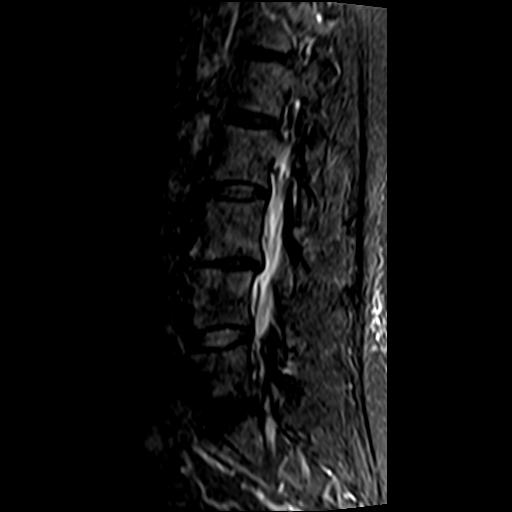
[im 9/15]
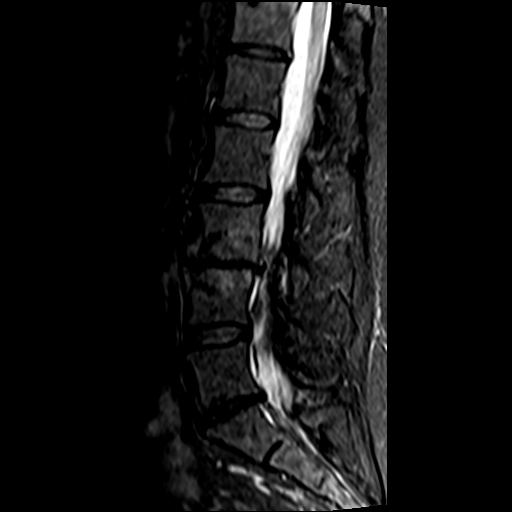
[im 15/15]
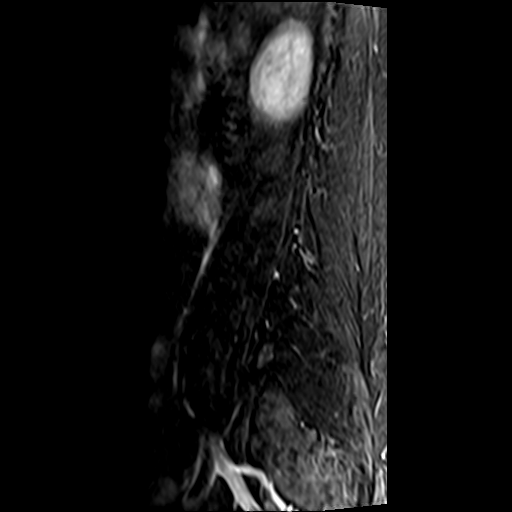

[25 of 48 positions shown; findings below may reference images not displayed]

FINDINGS: Spine labeling: Based on assumption of 5 lumbar type non-rib bearing vertebrae.

Alignment: Mild lumbar levoscoliosis with apex at L3-L4. Straightening of the lumbar lordosis. Mild grade 1 retrolisthesis of L3 on L4 and L5 on S1.  

Vertebrae and discs: Multilevel degenerative disc disease and discogenic endplate change, severe at L3-L4 and L5-S1. 

Marrow: No suspicious osseous lesion or acute fracture.

Conus: Ends at a normal level. Unremarkable cauda equina nerve roots.

T12-L1: Mild disc bulge. Small posterior annular fissure. No significant canal or neural foraminal stenosis.

L1-L2: Minimal disc bulge. No significant canal or neural foraminal stenosis.

L2-L3: Minimal disc bulge. Mild facet arthrosis. No significant canal or neural foraminal stenosis.

L3-L4: Mild disc bulge and endplate osteophytes. Mild facet arthrosis. Mild canal and mild right neural foraminal stenosis.

L4-L5: Mild ligamentum flavum thickening and facet arthrosis. No significant canal or neural foraminal stenosis.

L5-S1: Mild disc bulge and endplate osteophytes. Superimposed central disc osteophyte complex. Moderate facet arthrosis. Mild canal and bilateral lateral recess narrowing with encroachment upon the descending bilateral S1 nerve roots. No significant neural foraminal stenosis.

Bilateral lower paraspinous muscle atrophy. T2 hyperintense lesions in the left kidney, likely cysts.
IMPRESSION: 1.
Mild canal and bilateral lateral recess stenosis at L5-S1 secondary to a central disc osteophyte complex. Encroachment upon the descending bilateral S1 nerve roots.

2.
No high-grade canal or neural foraminal stenosis.

3.
Severe degenerative disc disease and endplate change at L3-L4 and L5-S1. 

4.
Small posterior annular fissure at T12-L1.

## 2023-05-05 ENCOUNTER — Ambulatory Visit: Admitting: Internal Medicine

## 2023-05-05 VITALS — BP 140/90 | HR 98 | Temp 97.4°F | Ht 63.0 in | Wt 227.0 lb

## 2023-05-05 DIAGNOSIS — Z9181 History of falling: Secondary | ICD-10-CM
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
## 2023-05-06 ENCOUNTER — Other Ambulatory Visit: Payer: Self-pay | Admitting: Internal Medicine

## 2023-05-06 DIAGNOSIS — M1712 Unilateral primary osteoarthritis, left knee: Secondary | ICD-10-CM

## 2023-05-06 DIAGNOSIS — Z9181 History of falling: Secondary | ICD-10-CM

## 2023-05-06 DIAGNOSIS — M47812 Spondylosis without myelopathy or radiculopathy, cervical region: Secondary | ICD-10-CM

## 2023-05-11 ENCOUNTER — Ambulatory Visit: Admitting: Internal Medicine

## 2023-05-11 ENCOUNTER — Encounter: Payer: Self-pay | Admitting: Internal Medicine

## 2023-05-11 VITALS — BP 140/84 | HR 103 | Temp 97.4°F | Ht 63.0 in | Wt 224.0 lb

## 2023-05-11 DIAGNOSIS — M170 Bilateral primary osteoarthritis of knee: Secondary | ICD-10-CM

## 2023-05-11 MED ORDER — DICLOFENAC SODIUM 1 % EX GEL
2.0000 g | Freq: Four times a day (QID) | CUTANEOUS | 0 refills | Status: AC
Start: 2023-05-11 — End: ?

## 2023-05-13 ENCOUNTER — Ambulatory Visit: Payer: PRIVATE HEALTH INSURANCE | Admitting: Internal Medicine

## 2023-05-17 ENCOUNTER — Other Ambulatory Visit: Payer: Self-pay | Admitting: Internal Medicine

## 2023-05-17 ENCOUNTER — Ambulatory Visit: Payer: PRIVATE HEALTH INSURANCE | Admitting: Internal Medicine

## 2023-05-18 ENCOUNTER — Ambulatory Visit: Payer: PRIVATE HEALTH INSURANCE | Admitting: Internal Medicine

## 2023-05-20 ENCOUNTER — Ambulatory Visit: Payer: PRIVATE HEALTH INSURANCE | Admitting: Internal Medicine

## 2023-05-26 ENCOUNTER — Encounter: Payer: Self-pay | Admitting: Internal Medicine

## 2023-05-26 ENCOUNTER — Ambulatory Visit: Admitting: Internal Medicine

## 2023-05-26 VITALS — BP 140/90 | HR 85 | Temp 97.6°F | Ht 63.0 in | Wt 230.0 lb

## 2023-05-26 DIAGNOSIS — M1712 Unilateral primary osteoarthritis, left knee: Secondary | ICD-10-CM

## 2023-05-26 DIAGNOSIS — E66812 Obesity, class 2: Secondary | ICD-10-CM

## 2023-05-26 DIAGNOSIS — Z9181 History of falling: Secondary | ICD-10-CM

## 2023-05-26 DIAGNOSIS — G5711 Meralgia paresthetica, right lower limb: Secondary | ICD-10-CM

## 2023-05-26 DIAGNOSIS — Z6839 Body mass index (BMI) 39.0-39.9, adult: Secondary | ICD-10-CM

## 2023-05-26 DIAGNOSIS — I10 Essential (primary) hypertension: Secondary | ICD-10-CM

## 2023-05-26 DIAGNOSIS — J301 Allergic rhinitis due to pollen: Secondary | ICD-10-CM
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
## 2023-05-27 ENCOUNTER — Ambulatory Visit: Payer: PRIVATE HEALTH INSURANCE | Admitting: Internal Medicine

## 2023-06-15 ENCOUNTER — Other Ambulatory Visit: Payer: Self-pay | Admitting: Internal Medicine

## 2023-06-15 DIAGNOSIS — Z9181 History of falling: Secondary | ICD-10-CM

## 2023-06-15 DIAGNOSIS — M79605 Pain in left leg: Secondary | ICD-10-CM

## 2023-06-15 DIAGNOSIS — R600 Localized edema: Secondary | ICD-10-CM

## 2023-07-12 ENCOUNTER — Other Ambulatory Visit: Payer: Self-pay

## 2023-07-28 ENCOUNTER — Encounter: Payer: Self-pay | Admitting: Internal Medicine

## 2023-07-30 ENCOUNTER — Other Ambulatory Visit: Payer: Self-pay | Admitting: Internal Medicine

## 2023-07-31 LAB — LIPID PANEL
Cholesterol / HDL Ratio: 2.4 ratio (ref 0.0–4.4)
Cholesterol: 193 mg/dL (ref 100–199)
HDL: 80 mg/dL (ref >39)
LDL Chol Calculated (NIH): 103 mg/dL — ABNORMAL HIGH (ref 0–99)
Triglycerides: 53 mg/dL (ref 0–149)
VLDL Calculated: 10 mg/dL (ref 5–40)

## 2023-07-31 LAB — URINALYSIS WITH REFLEX TO MICROSCOPIC EXAM IF INDICATED
Bilirubin, UA: NEGATIVE
Blood, UA: NEGATIVE
Glucose, UA: NEGATIVE
Ketones UA: NEGATIVE
Leukocyte Esterase, UA: NEGATIVE
Nitrite, UA: NEGATIVE
Protein, UR: NEGATIVE
Specific Gravity UA: 1.02 (ref 1.005–1.030)
Urine pH: 6.5 (ref 5.0–7.5)
Urobilinogen, UA: 0.2 mg/dL (ref 0.2–1.0)

## 2023-07-31 LAB — CBC AND DIFFERENTIAL
Baso(Absolute): 0 x10E3/uL (ref 0.0–0.2)
Basophils Automated: 0 %
Eosinophils Absolute: 0.1 x10E3/uL (ref 0.0–0.4)
Eosinophils Automated: 2 %
Hematocrit: 42.9 % (ref 34.0–46.6)
Hemoglobin: 13.3 g/dL (ref 11.1–15.9)
Immature Granulocytes Absolute: 0 x10E3/uL (ref 0.0–0.1)
Immature Granulocytes: 0 %
Lymphocytes Absolute: 2.6 x10E3/uL (ref 0.7–3.1)
Lymphocytes Automated: 35 %
MCH: 28.1 pg (ref 26.6–33.0)
MCHC: 31 g/dL — ABNORMAL LOW (ref 31.5–35.7)
MCV: 91 fL (ref 79–97)
Monocytes Absolute: 0.7 x10E3/uL (ref 0.1–0.9)
Monocytes: 9 %
Neutrophils Absolute Count: 4.1 x10E3/uL (ref 1.4–7.0)
Neutrophils: 54 %
Platelets: 299 x10E3/uL (ref 150–450)
RBC: 4.73 x10E6/uL (ref 3.77–5.28)
RDW: 12 % (ref 11.7–15.4)
WBC: 7.6 x10E3/uL (ref 3.4–10.8)

## 2023-07-31 LAB — BASIC METABOLIC PANEL
BUN / Creatinine Ratio: 14 (ref 12–28)
BUN: 12 mg/dL (ref 8–27)
CO2: 25 mmol/L (ref 20–29)
Calcium: 9.8 mg/dL (ref 8.7–10.3)
Chloride: 101 mmol/L (ref 96–106)
Creatinine: 0.84 mg/dL (ref 0.57–1.00)
Glucose: 83 mg/dL (ref 70–99)
Potassium: 3.9 mmol/L (ref 3.5–5.2)
Sodium: 141 mmol/L (ref 134–144)
eGFR: 72 mL/min/1.73 (ref >59)

## 2023-07-31 LAB — HEPATIC FUNCTION PANEL (LFT)
ALT: 16 IU/L (ref 0–32)
AST (SGOT): 18 IU/L (ref 0–40)
Albumin: 4.1 g/dL (ref 3.8–4.8)
Alkaline Phosphatase: 82 IU/L (ref 44–121)
Bilirubin Direct: 0.22 mg/dL (ref 0.00–0.40)
Bilirubin, Total: 0.6 mg/dL (ref 0.0–1.2)
Protein, Total: 7.1 g/dL (ref 6.0–8.5)

## 2023-07-31 LAB — TSH: TSH: 0.296 u[IU]/mL — ABNORMAL LOW (ref 0.450–4.500)

## 2023-07-31 LAB — HEMOGLOBIN A1C: Hemoglobin A1C: 5.8 % — ABNORMAL HIGH (ref 4.8–5.6)

## 2023-08-04 ENCOUNTER — Ambulatory Visit: Admitting: Internal Medicine

## 2023-08-04 ENCOUNTER — Encounter: Payer: Self-pay | Admitting: Internal Medicine

## 2023-08-04 VITALS — BP 140/80 | HR 91 | Temp 97.5°F | Ht 63.0 in | Wt 230.0 lb

## 2023-08-04 DIAGNOSIS — I10 Essential (primary) hypertension: Secondary | ICD-10-CM

## 2023-08-04 DIAGNOSIS — Z6839 Body mass index (BMI) 39.0-39.9, adult: Secondary | ICD-10-CM

## 2023-08-04 DIAGNOSIS — Z6841 Body Mass Index (BMI) 40.0 and over, adult: Secondary | ICD-10-CM

## 2023-08-04 DIAGNOSIS — E66812 Obesity, class 2: Secondary | ICD-10-CM

## 2023-08-04 DIAGNOSIS — E6609 Other obesity due to excess calories: Secondary | ICD-10-CM

## 2023-08-04 MED ORDER — SEMAGLUTIDE-WEIGHT MANAGEMENT 0.25 MG/0.5ML SC SOAJ
0.2500 mg | SUBCUTANEOUS | 0 refills | Status: AC
Start: 2023-08-04 — End: 2023-08-26

## 2023-08-04 MED ORDER — DICLOFENAC SODIUM 1 % EX GEL
4.0000 g | Freq: Four times a day (QID) | CUTANEOUS | 3 refills | Status: DC
Start: 2023-08-04 — End: 2023-10-25
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
## 2023-08-05 ENCOUNTER — Other Ambulatory Visit: Payer: Self-pay

## 2023-08-14 NOTE — ED Provider Notes (Signed)
 Patient Evaluated: 08/14/2023 - 6:01 AM - EL[facility] Results Pending    Means of arrival:Car        CC / HPI   Chief Complaint from Triage Note:   Patient presents with:  Pain, Hip: Patient states that she woke up with left hip/thigh pain. Denies injury. States that she lifted a big bottle of detergent at the store today.    Patient ambulatory to triage.     Hx htn         HPI:  Jamie Sharp   History of Present Illness  This is a 78 year old female with a history of hypertension presenting with hip pain.    The patient reports that the onset of her hip pain occurred today after lifting a heavy object at the store. She initially did not feel pain while lifting but experienced pain later on. The pain is localized to the back of her left hip, and back and radiates into her gluteal region. She describes the pain as intense and states that it worsens when she tries to sit or get up. She attempted to use a heating pad to alleviate the pain but found it too intense and decided to seek medical attention. Pt denies fever, chills, numbness or tingling down her leg, no loss of bowel or bladder.  The patient does not typically use any walking aids and has no previous history of hip or back issues. She arrived today with a cane.  She recently had a complete physical examination, including an x-ray, which showed no abnormalities. The patient was brought to the ED by her best friend.    The patient is currently on hydrochlorothiazide  25 mg for hypertension. She reports no known kidney issues. Her primary care physician is Dr. Meg.               Relevant Past Medical, Social and Family History - See resident note (if applicable) for additional historical information   Past Medical / Surgical History: No past medical history on file. No past surgical history on file.  Family History: family history is not on file.  Social Hx:         ALLERGIES: Amoxicillin, Codeine, Latex, Pcn [Penicillins]        Review of Systems  -  See HPI and resident note (if applicable)  for additional ROS Information   No LMP recorded.       Physical Exam     LAST  VS BP: (!) 173/93 Pulse: 96 (08/14/23 0631)  Heart Rate (Monitored): (not recorded) Resp: 16  SpO2: 95 % Temp: 36.6 C (97.9 F)   FIRST  VS BP: (!) 176/112 Pulse: (!) 104 (08/14/23 0002)  Heart Rate (Monitored): (not recorded) Resp: 18  SpO2: 95 % Temp: 36.6 C (97.9 F)   **These vitals reflect the first/last taken during this encounter. Within each group, they are not necessarily taken at the same time.        Physical Exam  Vitals and nursing note reviewed.   Constitutional:       General: She is not in acute distress.     Appearance: Normal appearance. She is not ill-appearing.   HENT:      Head: Normocephalic and atraumatic.      Right Ear: External ear normal.      Left Ear: External ear normal.      Nose: Nose normal.      Mouth/Throat:      Mouth: Mucous membranes are moist.  Pharynx: Oropharynx is clear.   Eyes:      Extraocular Movements: Extraocular movements intact.   Cardiovascular:      Rate and Rhythm: Normal rate and regular rhythm.      Pulses: Normal pulses.      Heart sounds: Normal heart sounds. No murmur heard.  Pulmonary:      Effort: Pulmonary effort is normal. No respiratory distress.      Breath sounds: Normal breath sounds.   Abdominal:      General: Abdomen is flat. Bowel sounds are normal.      Palpations: Abdomen is soft.      Tenderness: There is no abdominal tenderness. There is no guarding.   Musculoskeletal:         General: Tenderness present. No swelling or deformity. Normal range of motion.      Cervical back: Normal range of motion and neck supple.      Right lower leg: No edema.      Left lower leg: No edema.      Comments: Pt has tenderness in the posterior hip, SI joint area. Pt ROM limited by pain, passive range of motion is full. No clicking, crepitus felt. Pt has normal sensation and cap refill of her leg and foot.    Skin:     General: Skin is  warm and dry.      Capillary Refill: Capillary refill takes less than 2 seconds.      Findings: No bruising or rash.   Neurological:      General: No focal deficit present.      Mental Status: She is alert and oriented to person, place, and time.   Psychiatric:         Mood and Affect: Mood normal.          Diagnostic Studies   ECG Interpretation  No results found for this visit on 08/14/23.       No results found.           Progress Notes, Medical Decision Making and Critical Care     Assessment & Plan  Initial Assessment: 78 year old female with hip pain likely due to muscle spasms/strain from straining and stretching during a recent lifting activity. Gradual onset of pain suggests a muscular issue rather than a bone-related problem.  No acute trauma, no h/o of back or hip pain.      Differential Diagnosis:   - Bone issue: Unlikely due to gradual onset of pain and lack of immediate pain or popping sensation. No imaging required.  - Muscle spasms/strain: Likely due to recent lifting activity. Pt describes the pain coming and going like a cramp.  Plan to administer muscle relaxant (Robaxin ) and anti-inflammatory (Naprosyn).    ED Course:  - Administered Robaxin  (muscle relaxant), with good relief  - Administered Naprosyn (anti-inflammatory) with good relief.   - Pt states she feels much better and is ready to go home    Final Assessment: Administered Robaxin  and Naprosyn for suspected muscle spasms. No imaging required as bone issue is unlikely. Blood pressure elevated likely due to pain, expected to decrease as pain subsides. Follow up with PCP.    Clinical Impression:  - Muscle spasms  - Hypertension    Disposition:  - Discharge: Home, as pain relief achieved with medication. Advised to apply heat to the affected area.  - Follow-Up: Primary care physician for further evaluation and management.  Diagnostic Tests Considered : Xray of hip, lumbar spine but patient does not have red flag s/s  such as fever, chills, numbness, tingling.  Pt has good pulses, ROM, and no significant mechanism of injury.                             Clinical Impression and Disposition    Diagnosis:  1. Left hip pain      ED Disposition: Discharge - 08/14/2023 06:40  Condition at time of disposition: Good                Clarita Haw, CRNP  08/15/23 1137

## 2023-08-16 ENCOUNTER — Other Ambulatory Visit: Payer: Self-pay

## 2023-08-17 ENCOUNTER — Telehealth: Admitting: Internal Medicine

## 2023-08-17 DIAGNOSIS — M5416 Radiculopathy, lumbar region: Secondary | ICD-10-CM

## 2023-08-17 DIAGNOSIS — M164 Bilateral post-traumatic osteoarthritis of hip: Secondary | ICD-10-CM
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
## 2023-08-31 ENCOUNTER — Other Ambulatory Visit: Payer: Self-pay | Admitting: Internal Medicine

## 2023-09-02 ENCOUNTER — Other Ambulatory Visit: Payer: Self-pay

## 2023-09-07 ENCOUNTER — Encounter: Payer: Self-pay | Admitting: Internal Medicine

## 2023-09-07 ENCOUNTER — Ambulatory Visit: Admitting: Internal Medicine

## 2023-09-07 VITALS — BP 167/97 | HR 109 | Temp 97.6°F | Ht 63.0 in | Wt 220.0 lb

## 2023-09-07 DIAGNOSIS — M4722 Other spondylosis with radiculopathy, cervical region: Secondary | ICD-10-CM

## 2023-09-07 DIAGNOSIS — I10 Essential (primary) hypertension: Secondary | ICD-10-CM

## 2023-09-07 DIAGNOSIS — E66812 Obesity, class 2: Secondary | ICD-10-CM

## 2023-09-07 DIAGNOSIS — M47816 Spondylosis without myelopathy or radiculopathy, lumbar region: Secondary | ICD-10-CM

## 2023-09-07 DIAGNOSIS — M164 Bilateral post-traumatic osteoarthritis of hip: Secondary | ICD-10-CM

## 2023-09-07 DIAGNOSIS — Z6837 Body mass index (BMI) 37.0-37.9, adult: Secondary | ICD-10-CM

## 2023-09-07 DIAGNOSIS — M5416 Radiculopathy, lumbar region: Secondary | ICD-10-CM

## 2023-09-07 MED ORDER — METHOCARBAMOL 500 MG PO TABS
500.0000 mg | ORAL_TABLET | Freq: Three times a day (TID) | ORAL | 2 refills | Status: DC | PRN
Start: 2023-09-07 — End: 2023-10-25

## 2023-09-07 MED ORDER — PREGABALIN 50 MG PO CAPS: 50.0000 mg | ORAL_CAPSULE | Freq: Three times a day (TID) | ORAL | 0 refills | Status: DC
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
## 2023-10-07 ENCOUNTER — Other Ambulatory Visit: Payer: Self-pay | Admitting: Internal Medicine

## 2023-10-07 ENCOUNTER — Ambulatory Visit: Payer: PRIVATE HEALTH INSURANCE | Admitting: Internal Medicine

## 2023-10-25 ENCOUNTER — Encounter: Payer: Self-pay | Admitting: Internal Medicine

## 2023-10-25 ENCOUNTER — Ambulatory Visit: Payer: PRIVATE HEALTH INSURANCE | Admitting: Internal Medicine

## 2023-12-05 DIAGNOSIS — M47816 Spondylosis without myelopathy or radiculopathy, lumbar region: Secondary | ICD-10-CM | POA: Insufficient documentation

## 2023-12-05 DIAGNOSIS — M160 Bilateral primary osteoarthritis of hip: Secondary | ICD-10-CM | POA: Insufficient documentation

## 2023-12-09 ENCOUNTER — Encounter: Payer: Self-pay | Admitting: Internal Medicine

## 2024-01-03 ENCOUNTER — Ambulatory Visit: Payer: PRIVATE HEALTH INSURANCE | Admitting: Internal Medicine

## 2024-01-03 ENCOUNTER — Encounter: Payer: Self-pay | Admitting: Internal Medicine

## 2024-01-03 DIAGNOSIS — M5416 Radiculopathy, lumbar region: Secondary | ICD-10-CM

## 2024-01-03 DIAGNOSIS — M4722 Other spondylosis with radiculopathy, cervical region: Secondary | ICD-10-CM

## 2024-01-03 MED ORDER — ONDANSETRON HCL 4 MG PO TABS: 4.0000 mg | ORAL_TABLET | Freq: Three times a day (TID) | ORAL | 1 refills | Status: AC | PRN

## 2024-01-03 MED ORDER — AMLODIPINE BESYLATE 5 MG PO TABS: 5.0000 mg | ORAL_TABLET | Freq: Every day | ORAL | 3 refills | Status: AC

## 2024-01-03 MED ORDER — PREGABALIN 50 MG PO CAPS: 50.0000 mg | ORAL_CAPSULE | Freq: Three times a day (TID) | ORAL | 1 refills | Status: AC
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
## 2024-01-25 ENCOUNTER — Ambulatory Visit: Payer: PRIVATE HEALTH INSURANCE | Admitting: Internal Medicine

## 2024-02-01 ENCOUNTER — Ambulatory Visit: Payer: PRIVATE HEALTH INSURANCE | Admitting: Internal Medicine

## 2024-02-08 ENCOUNTER — Ambulatory Visit: Payer: PRIVATE HEALTH INSURANCE | Admitting: Internal Medicine

## 2024-02-14 ENCOUNTER — Ambulatory Visit: Payer: PRIVATE HEALTH INSURANCE | Admitting: Internal Medicine
